# Patient Record
Sex: Female | Born: 1995 | Race: White | Hispanic: No | Marital: Married | Smoking: Never smoker
Health system: Southern US, Community
[De-identification: ages and names within clinical notes are randomized; demographics above are authoritative.]

## PROBLEM LIST (undated history)

## (undated) DIAGNOSIS — R55 Syncope and collapse: Secondary | ICD-10-CM

## (undated) DIAGNOSIS — F1910 Other psychoactive substance abuse, uncomplicated: Secondary | ICD-10-CM

## (undated) DIAGNOSIS — T7840XA Allergy, unspecified, initial encounter: Secondary | ICD-10-CM

## (undated) DIAGNOSIS — F32A Depression, unspecified: Secondary | ICD-10-CM

## (undated) DIAGNOSIS — D649 Anemia, unspecified: Secondary | ICD-10-CM

## (undated) DIAGNOSIS — F419 Anxiety disorder, unspecified: Secondary | ICD-10-CM

## (undated) HISTORY — DX: Depression, unspecified: F32.A

## (undated) HISTORY — DX: Other psychoactive substance abuse, uncomplicated: F19.10

## (undated) HISTORY — DX: Syncope and collapse: R55

## (undated) HISTORY — DX: Anxiety disorder, unspecified: F41.9

## (undated) HISTORY — DX: Anemia, unspecified: D64.9

## (undated) HISTORY — DX: Allergy, unspecified, initial encounter: T78.40XA

---
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
## 2015-07-28 ENCOUNTER — Ambulatory Visit (INDEPENDENT_AMBULATORY_CARE_PROVIDER_SITE_OTHER): Payer: 59 | Admitting: Certified Nurse Midwife

## 2015-07-28 ENCOUNTER — Encounter: Payer: Self-pay | Admitting: Certified Nurse Midwife

## 2015-07-28 VITALS — BP 90/60 | HR 68 | Resp 16 | Ht 64.5 in | Wt 109.0 lb

## 2015-07-28 DIAGNOSIS — Z01419 Encounter for gynecological examination (general) (routine) without abnormal findings: Secondary | ICD-10-CM

## 2015-07-28 DIAGNOSIS — Z0000 Encounter for general adult medical examination without abnormal findings: Secondary | ICD-10-CM

## 2015-07-28 LAB — HEMOGLOBIN, FINGERSTICK: HEMOGLOBIN, FINGERSTICK: 11.8 g/dL — AB (ref 12.0–16.0)

## 2015-07-28 LAB — POCT URINALYSIS DIPSTICK
Bilirubin, UA: NEGATIVE
GLUCOSE UA: NEGATIVE
KETONES UA: NEGATIVE
Leukocytes, UA: NEGATIVE
Nitrite, UA: NEGATIVE
Protein, UA: NEGATIVE
RBC UA: NEGATIVE
UROBILINOGEN UA: NEGATIVE
pH, UA: 5

## 2015-07-28 NOTE — Progress Notes (Signed)
19 y.o. G0P0 Single  Caucasian Fe here to establish gyn care and  for annual exam. Periods are normal, monthly occasional every other month, no cramping, with heavy early in cycle. No problems with menses.History of Syncope with standing, or heat/stress induced. Sees PCP prn. Desires STD screening, all. Partner change. Condoms working well for contraception, does not desire change. No other health issues today. Will be starting college in spring.  Patient's last menstrual period was 06/30/2015.          Sexually active: Yes.    The current method of family planning is condoms all the time.    Exercising: No.  exercise Smoker:  no  Health Maintenance: Pap:  none MMG:  none Colonoscopy:  none BMD:   none TDaP:  2010 Labs: hgb-11.8, poct urine-neg Self breast exam: done monthly   reports that she has never smoked. She has never used smokeless tobacco. She reports that she does not drink alcohol or use illicit drugs.  Past Medical History  Diagnosis Date  . Neurocardiogenic syncope     History reviewed. No pertinent past surgical history.  Current Outpatient Prescriptions  Medication Sig Dispense Refill  . ibuprofen (ADVIL,MOTRIN) 200 MG tablet Take 600 mg by mouth every 6 (six) hours as needed for mild pain.     No current facility-administered medications for this visit.    Family History  Problem Relation Age of Onset  . Stroke Maternal Grandmother   . Cancer Maternal Grandfather   . Diabetes Maternal Grandfather   . Stroke Maternal Grandfather   . Heart attack Maternal Grandfather   . Breast cancer Paternal Grandmother     ROS:  Pertinent items are noted in HPI.  Otherwise, a comprehensive ROS was negative.  Exam:   BP 90/60 mmHg  Pulse 68  Resp 16  Ht 5' 4.5" (1.638 m)  Wt 109 lb (49.442 kg)  BMI 18.43 kg/m2  LMP 06/30/2015 Height: 5' 4.5" (163.8 cm) Ht Readings from Last 3 Encounters:  07/28/15 5' 4.5" (1.638 m) (53 %*, Z = 0.09)  10/14/13 5' 4.75" (1.645 m)  (59 %*, Z = 0.22)   * Growth percentiles are based on CDC 2-20 Years data.    General appearance: alert, cooperative and appears stated age Head: Normocephalic, without obvious abnormality, atraumatic Neck: no adenopathy, supple, symmetrical, trachea midline and thyroid normal to inspection and palpation Lungs: clear to auscultation bilaterally Breasts: normal appearance, no masses or tenderness, No nipple retraction or dimpling, No nipple discharge or bleeding, No axillary or supraclavicular adenopathy Heart: regular rate and rhythm Abdomen: soft, non-tender; no masses,  no organomegaly Extremities: extremities normal, atraumatic, no cyanosis or edema Skin: Skin color, texture, turgor normal. No rashes or lesions Lymph nodes: Cervical, supraclavicular, and axillary nodes normal. No abnormal inguinal nodes palpated Neurologic: Grossly normal   Pelvic: External genitalia:  no lesions              Urethra:  normal appearing urethra with no masses, tenderness or lesions              Bartholin's and Skene's: normal                 Vagina: normal appearing vagina with normal color and discharge, no lesions              Cervix: nomal, non tender,no lesions              Pap taken: No. Bimanual Exam:  Uterus:  normal size, contour,  position, consistency, mobility, non-tender and anteverted              Adnexa: normal adnexa and no mass, fullness, tenderness               Rectovaginal: Confirms               Anus:  normal appearance  Chaperone present: yes  A:  Well Woman with normal exam  Contraception condoms consistent  History of Syncope  STD screening  P:   Reviewed health and wellness pertinent to exam  Discussed importance of consistent use for STD and pregnancy prevention.  Aware of what she needs to do when syncope  occurs.  Labs: HIV,RPR,Hep. C, HSV 1,2, GC,Chlamydia, Affirm  Pap smear as above not taken   counseled on breast self exam, STD prevention, HIV risk factors and  prevention, adequate intake of calcium and vitamin D, diet and exercise  return annually or prn  An After Visit Summary was printed and given to the patient.

## 2015-07-28 NOTE — Patient Instructions (Signed)
General topics  Next pap or exam is  due in 1 year Take a Women's multivitamin Take 1200 mg. of calcium daily - prefer dietary If any concerns in interim to call back  Breast Self-Awareness Practicing breast self-awareness may pick up problems early, prevent significant medical complications, and possibly save your life. By practicing breast self-awareness, you can become familiar with how your breasts look and feel and if your breasts are changing. This allows you to notice changes early. It can also offer you some reassurance that your breast health is good. One way to learn what is normal for your breasts and whether your breasts are changing is to do a breast self-exam. If you find a lump or something that was not present in the past, it is best to contact your caregiver right away. Other findings that should be evaluated by your caregiver include nipple discharge, especially if it is bloody; skin changes or reddening; areas where the skin seems to be pulled in (retracted); or new lumps and bumps. Breast pain is seldom associated with cancer (malignancy), but should also be evaluated by a caregiver. BREAST SELF-EXAM The best time to examine your breasts is 5 7 days after your menstrual period is over.  ExitCare Patient Information 2013 [employer].   Exercise to Stay Healthy Exercise helps you become and stay healthy. EXERCISE IDEAS AND TIPS Choose exercises that:  You enjoy.  Fit into your day. You do not need to exercise really hard to be healthy. You can do exercises at a slow or medium level and stay healthy. You can:  Stretch before and after working out.  Try yoga, Pilates, or tai chi.  Lift weights.  Walk fast, swim, jog, run, climb stairs, bicycle, dance, or rollerskate.  Take aerobic classes. Exercises that burn about 150 calories:  Running 1  miles in 15 minutes.  Playing volleyball for 45 to 60 minutes.  Washing and waxing a car for 45 to 60  minutes.  Playing touch football for 45 minutes.  Walking 1  miles in 35 minutes.  Pushing a stroller 1  miles in 30 minutes.  Playing basketball for 30 minutes.  Raking leaves for 30 minutes.  Bicycling 5 miles in 30 minutes.  Walking 2 miles in 30 minutes.  Dancing for 30 minutes.  Shoveling snow for 15 minutes.  Swimming laps for 20 minutes.  Walking up stairs for 15 minutes.  Bicycling 4 miles in 15 minutes.  Gardening for 30 to 45 minutes.  Jumping rope for 15 minutes.  Washing windows or floors for 45 to 60 minutes. Document Released: 12/23/2010 Document Revised: 02/12/2012 Document Reviewed: 12/23/2010 ExitCare Patient Information 2013 [employer].   Other topics ( that may be useful information):    Sexually Transmitted Disease Sexually transmitted disease (STD) refers to any infection that is passed from person to person during sexual activity. This may happen by way of saliva, semen, blood, vaginal mucus, or urine. Common STDs include:  Gonorrhea.  Chlamydia.  Syphilis.  HIV/AIDS.  Genital herpes.  Hepatitis B and C.  Trichomonas.  Human papillomavirus (HPV).  Pubic lice. CAUSES  An STD may be spread by bacteria, virus, or parasite. A person can get an STD by:  Sexual intercourse with an infected person.  Sharing sex toys with an infected person.  Sharing needles with an infected person.  Having intimate contact with the genitals, mouth, or rectal areas of an infected person. SYMPTOMS  Some people may not have any symptoms, but   they can still pass the infection to others. Different STDs have different symptoms. Symptoms include:  Painful or bloody urination.  Pain in the pelvis, abdomen, vagina, anus, throat, or eyes.  Skin rash, itching, irritation, growths, or sores (lesions). These usually occur in the genital or anal area.  Abnormal vaginal discharge.  Penile discharge in men.  Soft, flesh-colored skin growths in the  genital or anal area.  Fever.  Pain or bleeding during sexual intercourse.  Swollen glands in the groin area.  Yellow skin and eyes (jaundice). This is seen with hepatitis. DIAGNOSIS  To make a diagnosis, your caregiver may:  Take a medical history.  Perform a physical exam.  Take a specimen (culture) to be examined.  Examine a sample of discharge under a microscope.  Perform blood test TREATMENT   Chlamydia, gonorrhea, trichomonas, and syphilis can be cured with antibiotic medicine.  Genital herpes, hepatitis, and HIV can be treated, but not cured, with prescribed medicines. The medicines will lessen the symptoms.  Genital warts from HPV can be treated with medicine or by freezing, burning (electrocautery), or surgery. Warts may come back.  HPV is a virus and cannot be cured with medicine or surgery.However, abnormal areas may be followed very closely by your caregiver and may be removed from the cervix, vagina, or vulva through office procedures or surgery. If your diagnosis is confirmed, your recent sexual partners need treatment. This is true even if they are symptom-free or have a negative culture or evaluation. They should not have sex until their caregiver says it is okay. HOME CARE INSTRUCTIONS  All sexual partners should be informed, tested, and treated for all STDs.  Take your antibiotics as directed. Finish them even if you start to feel better.  Only take over-the-counter or prescription medicines for pain, discomfort, or fever as directed by your caregiver.  Rest.  Eat a balanced diet and drink enough fluids to keep your urine clear or pale yellow.  Do not have sex until treatment is completed and you have followed up with your caregiver. STDs should be checked after treatment.  Keep all follow-up appointments, Pap tests, and blood tests as directed by your caregiver.  Only use latex condoms and water-soluble lubricants during sexual activity. Do not use  petroleum jelly or oils.  Avoid alcohol and illegal drugs.  Get vaccinated for HPV and hepatitis. If you have not received these vaccines in the past, talk to your caregiver about whether one or both might be right for you.  Avoid risky sex practices that can break the skin. The only way to avoid getting an STD is to avoid all sexual activity.Latex condoms and dental dams (for oral sex) will help lessen the risk of getting an STD, but will not completely eliminate the risk. SEEK MEDICAL CARE IF:   You have a fever.  You have any new or worsening symptoms. Document Released: 02/10/2003 Document Revised: 02/12/2012 Document Reviewed: 02/17/2011 [facility] Patient Information 2013 [place].    Domestic Abuse You are being battered or abused if someone close to you hits, pushes, or physically hurts you in any way. You also are being abused if you are forced into activities. You are being sexually abused if you are forced to have sexual contact of any kind. You are being emotionally abused if you are made to feel worthless or if you are constantly threatened. It is important to remember that help is available. No one has the right to abuse you. PREVENTION OF FURTHER  ABUSE  Learn the warning signs of danger. This varies with situations but may include: the use of alcohol, threats, isolation from friends and family, or forced sexual contact. Leave if you feel that violence is going to occur.  If you are attacked or beaten, report it to the police so the abuse is documented. You do not have to press charges. The police can protect you while you or the attackers are leaving. Get the officer's name and badge number and a copy of the report.  Find someone you can trust and tell them what is happening to you: your caregiver, a nurse, clergy member, close friend or family member. Feeling ashamed is natural, but remember that you have done nothing wrong. No one deserves abuse. Document Released:  11/17/2000 Document Revised: 02/12/2012 Document Reviewed: 01/26/2011 ExitCare Patient Information 2013 [employer].    How Much is Too Much Alcohol? Drinking too much alcohol can cause injury, accidents, and health problems. These types of problems can include:   Car crashes.  Falls.  Family fighting (domestic violence).  Drowning.  Fights.  Injuries.  Burns.  Damage to certain organs.  Having a baby with birth defects. ONE DRINK CAN BE TOO MUCH WHEN YOU ARE:  Working.  Pregnant or breastfeeding.  Taking medicines. Ask your doctor.  Driving or planning to drive. If you or someone you know has a drinking problem, get help from a doctor.  Document Released: 09/16/2009 Document Revised: 02/12/2012 Document Reviewed: 09/16/2009 ExitCare Patient Information 2013 [employer].   Smoking Hazards Smoking cigarettes is extremely bad for your health. Tobacco smoke has over 200 known poisons in it. There are over 60 chemicals in tobacco smoke that cause cancer. Some of the chemicals found in cigarette smoke include:   Cyanide.  Benzene.  Formaldehyde.  Methanol (wood alcohol).  Acetylene (fuel used in welding torches).  Ammonia. Cigarette smoke also contains the poisonous gases nitrogen oxide and carbon monoxide.  Cigarette smokers have an increased risk of many serious medical problems and Smoking causes approximately:  90% of all lung cancer deaths in men.  80% of all lung cancer deaths in women.  90% of deaths from chronic obstructive lung disease. Compared with nonsmokers, smoking increases the risk of:  Coronary heart disease by 2 to 4 times.  Stroke by 2 to 4 times.  Men developing lung cancer by 23 times.  Women developing lung cancer by 13 times.  Dying from chronic obstructive lung diseases by 12 times.  . Smoking is the most preventable cause of death and disease in our society.  WHY IS SMOKING ADDICTIVE?  Nicotine is the chemical  agent in tobacco that is capable of causing addiction or dependence.  When you smoke and inhale, nicotine is absorbed rapidly into the bloodstream through your lungs. Nicotine absorbed through the lungs is capable of creating a powerful addiction. Both inhaled and non-inhaled nicotine may be addictive.  Addiction studies of cigarettes and spit tobacco show that addiction to nicotine occurs mainly during the teen years, when young people begin using tobacco products. WHAT ARE THE BENEFITS OF QUITTING?  There are many health benefits to quitting smoking.   Likelihood of developing cancer and heart disease decreases. Health improvements are seen almost immediately.  Blood pressure, pulse rate, and breathing patterns start returning to normal soon after quitting. QUITTING SMOKING   American Lung Association - 1-800-LUNGUSA  American Cancer Society - 1-800-ACS-2345 Document Released: 12/28/2004 Document Revised: 02/12/2012 Document Reviewed: 09/01/2009 ExitCare Patient Information 2013 ExitCare[employer]   LL[employer].   Stress Management Stress is a state of physical or mental tension that often results from changes in your life or normal routine. Some common causes of stress are:  Death of a loved one.  Injuries or severe illnesses.  Getting fired or changing jobs.  Moving into a new home. Other causes may be:  Sexual problems.  Business or financial losses.  Taking on a large debt.  Regular conflict with someone at home or at work.  Constant tiredness from lack of sleep. It is not just bad things that are stressful. It may be stressful to:  Win the lottery.  Get married.  Buy a new car. The amount of stress that can be easily tolerated varies from person to person. Changes generally cause stress, regardless of the types of change. Too much stress can affect your health. It may lead to physical or emotional problems. Too little stress (boredom) may also become stressful. SUGGESTIONS TO  REDUCE STRESS:  Talk things over with your family and friends. It often is helpful to share your concerns and worries. If you feel your problem is serious, you may want to get help from a professional counselor.  Consider your problems one at a time instead of lumping them all together. Trying to take care of everything at once may seem impossible. List all the things you need to do and then start with the most important one. Set a goal to accomplish 2 or 3 things each day. If you expect to do too many in a single day you will naturally fail, causing you to feel even more stressed.  Do not use alcohol or drugs to relieve stress. Although you may feel better for a short time, they do not remove the problems that caused the stress. They can also be habit forming.  Exercise regularly - at least 3 times per week. Physical exercise can help to relieve that "uptight" feeling and will relax you.  The shortest distance between despair and hope is often a good night's sleep.  Go to bed and get up on time allowing yourself time for appointments without being rushed.  Take a short "time-out" period from any stressful situation that occurs during the day. Close your eyes and take some deep breaths. Starting with the muscles in your face, tense them, hold it for a few seconds, then relax. Repeat this with the muscles in your neck, shoulders, hand, stomach, back and legs.  Take good care of yourself. Eat a balanced diet and get plenty of rest.  Schedule time for having fun. Take a break from your daily routine to relax. HOME CARE INSTRUCTIONS   Call if you feel overwhelmed by your problems and feel you can no longer manage them on your own.  Return immediately if you feel like hurting yourself or someone else. Document Released: 05/16/2001 Document Revised: 02/12/2012 Document Reviewed: 01/06/2008 [facility] Patient Information 2013 [place].   Great to meet you today. Reduce your caffeine to see  if this decreases breast tenderness. Call if no change Have a good summer. Natalie [name]

## 2015-07-29 LAB — RPR

## 2015-07-29 LAB — WET PREP BY MOLECULAR PROBE
Candida species: NEGATIVE
GARDNERELLA VAGINALIS: NEGATIVE
Trichomonas vaginosis: NEGATIVE

## 2015-07-29 LAB — HEPATITIS C ANTIBODY: HCV AB: NEGATIVE

## 2015-07-29 LAB — HSV(HERPES SIMPLEX VRS) I + II AB-IGG: HSV 2 Glycoprotein G Ab, IgG: <0.1 IV

## 2015-07-29 LAB — HIV ANTIBODY (ROUTINE TESTING W REFLEX): HIV 1&2 Ab, 4th Generation: NONREACTIVE

## 2015-07-30 LAB — IPS N GONORRHOEA AND CHLAMYDIA BY PCR

## 2015-07-30 NOTE — Progress Notes (Signed)
Reviewed personally.  M. Suzanne Johnisha Louks, MD.  

## 2016-07-28 ENCOUNTER — Encounter: Payer: Self-pay | Admitting: Certified Nurse Midwife

## 2016-07-28 ENCOUNTER — Ambulatory Visit (INDEPENDENT_AMBULATORY_CARE_PROVIDER_SITE_OTHER): Payer: 59 | Admitting: Certified Nurse Midwife

## 2016-07-28 VITALS — BP 98/60 | HR 80 | Resp 16 | Ht 64.0 in | Wt 106.2 lb

## 2016-07-28 DIAGNOSIS — Z30011 Encounter for initial prescription of contraceptive pills: Secondary | ICD-10-CM

## 2016-07-28 DIAGNOSIS — N920 Excessive and frequent menstruation with regular cycle: Secondary | ICD-10-CM

## 2016-07-28 DIAGNOSIS — Z01419 Encounter for gynecological examination (general) (routine) without abnormal findings: Secondary | ICD-10-CM | POA: Diagnosis not present

## 2016-07-28 MED ORDER — NORETHIN ACE-ETH ESTRAD-FE 1-20 MG-MCG PO TABS: 1.0000 | ORAL_TABLET | Freq: Every day | ORAL | 1 refills | Status: DC

## 2016-07-28 NOTE — Progress Notes (Signed)
20 y.o. G0P0 Single  Caucasian Fe here for annual exam. Periods really heavy day 1-2 days then lighter, some cramping and diarrhea at onset. Regular, no missed periods. Heavy since onset. Desires no labs unless absolutely necessary. Patient interested in starting on OCP for cycle control and contraception if needed. Sees PCP prn. [facility]  Patient's last menstrual period was 07/15/2016.          Sexually active: No.  The current method of family planning is condoms most of the time.    Exercising: No.  The patient does not participate in regular exercise at present. Smoker:  no  Health Maintenance: TDaP:  2010 Hep C and HIV: 07/28/15 Neg Labs: No Self breast exams: yes   reports that she has never smoked. She has never used smokeless tobacco. She reports that she does not drink alcohol or use drugs.  Past Medical History:  Diagnosis Date  . Neurocardiogenic syncope     No past surgical history on file.  Current Outpatient Prescriptions  Medication Sig Dispense Refill  . ibuprofen (ADVIL,MOTRIN) 200 MG tablet Take 600 mg by mouth every 6 (six) hours as needed for mild pain.     No current facility-administered medications for this visit.     Family History  Problem Relation Age of Onset  . Stroke Maternal Grandmother   . Cancer Maternal Grandfather   . Diabetes Maternal Grandfather   . Stroke Maternal Grandfather   . Heart attack Maternal Grandfather   . Breast cancer Paternal Grandmother 60    ROS:  Pertinent items are noted in HPI.  Otherwise, a comprehensive ROS was negative.  Exam:   BP 98/60 (BP Location: Right Arm, Patient Position: Sitting, Cuff Size: Normal)   Pulse 80   Resp 16   Ht 5\' 4"  (1.626 m)   Wt 106 lb 3.2 oz (48.2 kg)   LMP 07/15/2016   BMI 18.23 kg/m  Height: 5\' 4"  (162.6 cm) Ht Readings from Last 3 Encounters:  07/28/16 5\' 4"  (1.626 m)  07/28/15 5' 4.5" (1.638 m) (53 %, Z= 0.09)*  10/14/13 5' 4.75" (1.645 m) (59 %, Z= 0.22)*   *  Growth percentiles are based on CDC 2-20 Years data.    General appearance: alert, cooperative and appears stated age Head: Normocephalic, without obvious abnormality, atraumatic Neck: no adenopathy, supple, symmetrical, trachea midline and thyroid normal to inspection and palpation Lungs: clear to auscultation bilaterally Breasts: normal appearance, no masses or tenderness, No nipple retraction or dimpling, No nipple discharge or bleeding, No axillary or supraclavicular adenopathy Heart: regular rate and rhythm Abdomen: soft, non-tender; no masses,  no organomegaly Extremities: extremities normal, atraumatic, no cyanosis or edema Skin: Skin color, texture, turgor normal. No rashes or lesions Lymph nodes: Cervical, supraclavicular, and axillary nodes normal. No abnormal inguinal nodes palpated Neurologic: Grossly normal   Pelvic: External genitalia:  no lesions              Urethra:  normal appearing urethra with no masses, tenderness or lesions              Bartholin's and Skene's: normal                 Vagina: normal appearing vagina with normal color and discharge, no lesions              Cervix: no cervical motion tenderness, no lesions and nulliparous appearance              Pap  taken: No. Bimanual Exam:  Uterus:  normal size, contour, position, consistency, mobility, non-tender              Adnexa: normal adnexa and no mass, fullness, tenderness               Rectovaginal: Confirms               Anus:  normal   Chaperone present: yes  A:  Well Woman with normal exam  Menorrhagia with regular cycle, desires cycle control and contraception with OCP  P:   Reviewed health and wellness pertinent to exam  Discussed risks and benefits for OCP, expectations with cycle control, warning signs and side effects. Questions addressed and would to try.  Rx Loestrin 1/20 Fe see order with instructions, recheck in 3 months for evaluation  Pap smear as above not taken   counseled on breast  self exam, STD prevention, HIV risk factors and prevention, use and side effects of OCP's, adequate intake of calcium and vitamin D, diet and exercise  return annually or prn, as above  An After Visit Summary was printed and given to the patient.

## 2016-07-28 NOTE — Patient Instructions (Signed)
General topics  Next pap or exam is  due in 1 year Take a Women's multivitamin Take 1200 mg. of calcium daily - prefer dietary If any concerns in interim to call back  Breast Self-Awareness Practicing breast self-awareness may pick up problems early, prevent significant medical complications, and possibly save your life. By practicing breast self-awareness, you can become familiar with how your breasts look and feel and if your breasts are changing. This allows you to notice changes early. It can also offer you some reassurance that your breast health is good. One way to learn what is normal for your breasts and whether your breasts are changing is to do a breast self-exam. If you find a lump or something that was not present in the past, it is best to contact your caregiver right away. Other findings that should be evaluated by your caregiver include nipple discharge, especially if it is bloody; skin changes or reddening; areas where the skin seems to be pulled in (retracted); or new lumps and bumps. Breast pain is seldom associated with cancer (malignancy), but should also be evaluated by a caregiver. BREAST SELF-EXAM The best time to examine your breasts is 5 7 days after your menstrual period is over.  ExitCare Patient Information 2013 [employer].   Exercise to Stay Healthy Exercise helps you become and stay healthy. EXERCISE IDEAS AND TIPS Choose exercises that:  You enjoy.  Fit into your day. You do not need to exercise really hard to be healthy. You can do exercises at a slow or medium level and stay healthy. You can:  Stretch before and after working out.  Try yoga, Pilates, or tai chi.  Lift weights.  Walk fast, swim, jog, run, climb stairs, bicycle, dance, or rollerskate.  Take aerobic classes. Exercises that burn about 150 calories:  Running 1  miles in 15 minutes.  Playing volleyball for 45 to 60 minutes.  Washing and waxing a car for 45 to 60  minutes.  Playing touch football for 45 minutes.  Walking 1  miles in 35 minutes.  Pushing a stroller 1  miles in 30 minutes.  Playing basketball for 30 minutes.  Raking leaves for 30 minutes.  Bicycling 5 miles in 30 minutes.  Walking 2 miles in 30 minutes.  Dancing for 30 minutes.  Shoveling snow for 15 minutes.  Swimming laps for 20 minutes.  Walking up stairs for 15 minutes.  Bicycling 4 miles in 15 minutes.  Gardening for 30 to 45 minutes.  Jumping rope for 15 minutes.  Washing windows or floors for 45 to 60 minutes. Document Released: 12/23/2010 Document Revised: 02/12/2012 Document Reviewed: 12/23/2010 ExitCare Patient Information 2013 [employer].   Other topics ( that may be useful information):    Sexually Transmitted Disease Sexually transmitted disease (STD) refers to any infection that is passed from person to person during sexual activity. This may happen by way of saliva, semen, blood, vaginal mucus, or urine. Common STDs include:  Gonorrhea.  Chlamydia.  Syphilis.  HIV/AIDS.  Genital herpes.  Hepatitis B and C.  Trichomonas.  Human papillomavirus (HPV).  Pubic lice. CAUSES  An STD may be spread by bacteria, virus, or parasite. A person can get an STD by:  Sexual intercourse with an infected person.  Sharing sex toys with an infected person.  Sharing needles with an infected person.  Having intimate contact with the genitals, mouth, or rectal areas of an infected person. SYMPTOMS  Some people may not have any symptoms, but   they can still pass the infection to others. Different STDs have different symptoms. Symptoms include:  Painful or bloody urination.  Pain in the pelvis, abdomen, vagina, anus, throat, or eyes.  Skin rash, itching, irritation, growths, or sores (lesions). These usually occur in the genital or anal area.  Abnormal vaginal discharge.  Penile discharge in men.  Soft, flesh-colored skin growths in the  genital or anal area.  Fever.  Pain or bleeding during sexual intercourse.  Swollen glands in the groin area.  Yellow skin and eyes (jaundice). This is seen with hepatitis. DIAGNOSIS  To make a diagnosis, your caregiver may:  Take a medical history.  Perform a physical exam.  Take a specimen (culture) to be examined.  Examine a sample of discharge under a microscope.  Perform blood test TREATMENT   Chlamydia, gonorrhea, trichomonas, and syphilis can be cured with antibiotic medicine.  Genital herpes, hepatitis, and HIV can be treated, but not cured, with prescribed medicines. The medicines will lessen the symptoms.  Genital warts from HPV can be treated with medicine or by freezing, burning (electrocautery), or surgery. Warts may come back.  HPV is a virus and cannot be cured with medicine or surgery.However, abnormal areas may be followed very closely by your caregiver and may be removed from the cervix, vagina, or vulva through office procedures or surgery. If your diagnosis is confirmed, your recent sexual partners need treatment. This is true even if they are symptom-free or have a negative culture or evaluation. They should not have sex until their caregiver says it is okay. HOME CARE INSTRUCTIONS  All sexual partners should be informed, tested, and treated for all STDs.  Take your antibiotics as directed. Finish them even if you start to feel better.  Only take over-the-counter or prescription medicines for pain, discomfort, or fever as directed by your caregiver.  Rest.  Eat a balanced diet and drink enough fluids to keep your urine clear or pale yellow.  Do not have sex until treatment is completed and you have followed up with your caregiver. STDs should be checked after treatment.  Keep all follow-up appointments, Pap tests, and blood tests as directed by your caregiver.  Only use latex condoms and water-soluble lubricants during sexual activity. Do not use  petroleum jelly or oils.  Avoid alcohol and illegal drugs.  Get vaccinated for HPV and hepatitis. If you have not received these vaccines in the past, talk to your caregiver about whether one or both might be right for you.  Avoid risky sex practices that can break the skin. The only way to avoid getting an STD is to avoid all sexual activity.Latex condoms and dental dams (for oral sex) will help lessen the risk of getting an STD, but will not completely eliminate the risk. SEEK MEDICAL CARE IF:   You have a fever.  You have any new or worsening symptoms. Document Released: 02/10/2003 Document Revised: 02/12/2012 Document Reviewed: 02/17/2011 [facility] Patient Information 2013 [employer].    Domestic Abuse You are being battered or abused if someone close to you hits, pushes, or physically hurts you in any way. You also are being abused if you are forced into activities. You are being sexually abused if you are forced to have sexual contact of any kind. You are being emotionally abused if you are made to feel worthless or if you are constantly threatened. It is important to remember that help is available. No one has the right to abuse you. PREVENTION OF FURTHER  ABUSE  Learn the warning signs of danger. This varies with situations but may include: the use of alcohol, threats, isolation from friends and family, or forced sexual contact. Leave if you feel that violence is going to occur.  If you are attacked or beaten, report it to the police so the abuse is documented. You do not have to press charges. The police can protect you while you or the attackers are leaving. Get the officer's name and badge number and a copy of the report.  Find someone you can trust and tell them what is happening to you: your caregiver, a nurse, clergy member, close friend or family member. Feeling ashamed is natural, but remember that you have done nothing wrong. No one deserves abuse. Document Released:  11/17/2000 Document Revised: 02/12/2012 Document Reviewed: 01/26/2011 ExitCare Patient Information 2013 [employer].    How Much is Too Much Alcohol? Drinking too much alcohol can cause injury, accidents, and health problems. These types of problems can include:   Car crashes.  Falls.  Family fighting (domestic violence).  Drowning.  Fights.  Injuries.  Burns.  Damage to certain organs.  Having a baby with birth defects. ONE DRINK CAN BE TOO MUCH WHEN YOU ARE:  Working.  Pregnant or breastfeeding.  Taking medicines. Ask your doctor.  Driving or planning to drive. If you or someone you know has a drinking problem, get help from a doctor.  Document Released: 09/16/2009 Document Revised: 02/12/2012 Document Reviewed: 09/16/2009 ExitCare Patient Information 2013 [employer].   Smoking Hazards Smoking cigarettes is extremely bad for your health. Tobacco smoke has over 200 known poisons in it. There are over 60 chemicals in tobacco smoke that cause cancer. Some of the chemicals found in cigarette smoke include:   Cyanide.  Benzene.  Formaldehyde.  Methanol (wood alcohol).  Acetylene (fuel used in welding torches).  Ammonia. Cigarette smoke also contains the poisonous gases nitrogen oxide and carbon monoxide.  Cigarette smokers have an increased risk of many serious medical problems and Smoking causes approximately:  90% of all lung cancer deaths in men.  80% of all lung cancer deaths in women.  90% of deaths from chronic obstructive lung disease. Compared with nonsmokers, smoking increases the risk of:  Coronary heart disease by 2 to 4 times.  Stroke by 2 to 4 times.  Men developing lung cancer by 23 times.  Women developing lung cancer by 13 times.  Dying from chronic obstructive lung diseases by 12 times.  . Smoking is the most preventable cause of death and disease in our society.  WHY IS SMOKING ADDICTIVE?  Nicotine is the chemical  agent in tobacco that is capable of causing addiction or dependence.  When you smoke and inhale, nicotine is absorbed rapidly into the bloodstream through your lungs. Nicotine absorbed through the lungs is capable of creating a powerful addiction. Both inhaled and non-inhaled nicotine may be addictive.  Addiction studies of cigarettes and spit tobacco show that addiction to nicotine occurs mainly during the teen years, when young people begin using tobacco products. WHAT ARE THE BENEFITS OF QUITTING?  There are many health benefits to quitting smoking.   Likelihood of developing cancer and heart disease decreases. Health improvements are seen almost immediately.  Blood pressure, pulse rate, and breathing patterns start returning to normal soon after quitting. QUITTING SMOKING   American Lung Association - 1-800-LUNGUSA  American Cancer Society - 1-800-ACS-2345 Document Released: 12/28/2004 Document Revised: 02/12/2012 Document Reviewed: 09/01/2009 ExitCare Patient Information 2013 ExitCare[employer]   LL[employer].   Stress Management Stress is a state of physical or mental tension that often results from changes in your life or normal routine. Some common causes of stress are:  Death of a loved one.  Injuries or severe illnesses.  Getting fired or changing jobs.  Moving into a new home. Other causes may be:  Sexual problems.  Business or financial losses.  Taking on a large debt.  Regular conflict with someone at home or at work.  Constant tiredness from lack of sleep. It is not just bad things that are stressful. It may be stressful to:  Win the lottery.  Get married.  Buy a new car. The amount of stress that can be easily tolerated varies from person to person. Changes generally cause stress, regardless of the types of change. Too much stress can affect your health. It may lead to physical or emotional problems. Too little stress (boredom) may also become stressful. SUGGESTIONS TO  REDUCE STRESS:  Talk things over with your family and friends. It often is helpful to share your concerns and worries. If you feel your problem is serious, you may want to get help from a professional counselor.  Consider your problems one at a time instead of lumping them all together. Trying to take care of everything at once may seem impossible. List all the things you need to do and then start with the most important one. Set a goal to accomplish 2 or 3 things each day. If you expect to do too many in a single day you will naturally fail, causing you to feel even more stressed.  Do not use alcohol or drugs to relieve stress. Although you may feel better for a short time, they do not remove the problems that caused the stress. They can also be habit forming.  Exercise regularly - at least 3 times per week. Physical exercise can help to relieve that "uptight" feeling and will relax you.  The shortest distance between despair and hope is often a good night's sleep.  Go to bed and get up on time allowing yourself time for appointments without being rushed.  Take a short "time-out" period from any stressful situation that occurs during the day. Close your eyes and take some deep breaths. Starting with the muscles in your face, tense them, hold it for a few seconds, then relax. Repeat this with the muscles in your neck, shoulders, hand, stomach, back and legs.  Take good care of yourself. Eat a balanced diet and get plenty of rest.  Schedule time for having fun. Take a break from your daily routine to relax. HOME CARE INSTRUCTIONS   Call if you feel overwhelmed by your problems and feel you can no longer manage them on your own.  Return immediately if you feel like hurting yourself or someone else. Document Released: 05/16/2001 Document Revised: 02/12/2012 Document Reviewed: 01/06/2008 ExitCare Patient Information 2013 [employer].  Oral Contraception Use Oral contraceptive pills (OCPs)  are medicines taken to prevent pregnancy. OCPs work by preventing the ovaries from releasing eggs. The hormones in OCPs also cause the cervical mucus to thicken, preventing the sperm from entering the uterus. The hormones also cause the uterine lining to become thin, not allowing a fertilized egg to attach to the inside of the uterus. OCPs are highly effective when taken exactly as prescribed. However, OCPs do not prevent sexually transmitted diseases (STDs). Safe sex practices, such as using condoms along with an OCP, can help prevent STDs. Before   taking OCPs, you may have a physical exam and Pap test. Your health care provider may also order blood tests if necessary. Your health care provider will make sure you are a good candidate for oral contraception. Discuss with your health care provider the possible side effects of the OCP you may be prescribed. When starting an OCP, it can take 2 to 3 months for the body to adjust to the changes in hormone levels in your body.  HOW TO TAKE ORAL CONTRACEPTIVE PILLS Your health care provider may advise you on how to start taking the first cycle of OCPs. Otherwise, you can:   Start on day 1 of your menstrual period. You will not need any backup contraceptive protection with this start time.   Start on the first Sunday after your menstrual period or the day you get your prescription. In these cases, you will need to use backup contraceptive protection for the first week.   Start the pill at any time of your cycle. If you take the pill within 5 days of the start of your period, you are protected against pregnancy right away. In this case, you will not need a backup form of birth control. If you start at any other time of your menstrual cycle, you will need to use another form of birth control for 7 days. If your OCP is the type called a minipill, it will protect you from pregnancy after taking it for 2 days (48 hours). After you have started taking OCPs:   If you  forget to take 1 pill, take it as soon as you remember. Take the next pill at the regular time.   If you miss 2 or more pills, call your health care provider because different pills have different instructions for missed doses. Use backup birth control until your next menstrual period starts.   If you use a 28-day pack that contains inactive pills and you miss 1 of the last 7 pills (pills with no hormones), it will not matter. Throw away the rest of the non-hormone pills and start a new pill pack.  No matter which day you start the OCP, you will always start a new pack on that same day of the week. Have an extra pack of OCPs and a backup contraceptive method available in case you miss some pills or lose your OCP pack.  HOME CARE INSTRUCTIONS   Do not smoke.   Always use a condom to protect against STDs. OCPs do not protect against STDs.   Use a calendar to mark your menstrual period days.   Read the information and directions that came with your OCP. Talk to your health care provider if you have questions.  SEEK MEDICAL CARE IF:   You develop nausea and vomiting.   You have abnormal vaginal discharge or bleeding.   You develop a rash.   You miss your menstrual period.   You are losing your hair.   You need treatment for mood swings or depression.   You get dizzy when taking the OCP.   You develop acne from taking the OCP.   You become pregnant.  SEEK IMMEDIATE MEDICAL CARE IF:   You develop chest pain.   You develop shortness of breath.   You have an uncontrolled or severe headache.   You develop numbness or slurred speech.   You develop visual problems.   You develop pain, redness, and swelling in the legs.    This information is not intended to   replace advice given to you by your health care provider. Make sure you discuss any questions you have with your health care provider.   Document Released: 11/09/2011 Document Revised: 12/11/2014  Document Reviewed: 05/11/2013 Elsevier Interactive Patient Education 2016 Elsevier Inc.  

## 2016-07-29 NOTE — Progress Notes (Signed)
Encounter reviewed Jeanifer Halliday, MD   

## 2016-09-03 DIAGNOSIS — Z23 Encounter for immunization: Secondary | ICD-10-CM | POA: Diagnosis not present

## 2016-09-03 DIAGNOSIS — Z111 Encounter for screening for respiratory tuberculosis: Secondary | ICD-10-CM | POA: Diagnosis not present

## 2016-11-03 ENCOUNTER — Ambulatory Visit (INDEPENDENT_AMBULATORY_CARE_PROVIDER_SITE_OTHER): Payer: 59 | Admitting: Certified Nurse Midwife

## 2016-11-03 ENCOUNTER — Encounter: Payer: Self-pay | Admitting: Certified Nurse Midwife

## 2016-11-03 VITALS — BP 98/62 | HR 60 | Resp 16 | Ht 64.0 in | Wt 106.0 lb

## 2016-11-03 DIAGNOSIS — N920 Excessive and frequent menstruation with regular cycle: Secondary | ICD-10-CM | POA: Diagnosis not present

## 2016-11-03 DIAGNOSIS — Z30011 Encounter for initial prescription of contraceptive pills: Secondary | ICD-10-CM | POA: Diagnosis not present

## 2016-11-03 DIAGNOSIS — Z3041 Encounter for surveillance of contraceptive pills: Secondary | ICD-10-CM

## 2016-11-03 MED ORDER — NORETHIN ACE-ETH ESTRAD-FE 1-20 MG-MCG PO TABS: 1.0000 | ORAL_TABLET | Freq: Every day | ORAL | 4 refills | Status: DC

## 2016-11-03 NOTE — Progress Notes (Signed)
20 y.o. Single Caucasian [id number] for evaluation of  initiated on August for 07/28/16. Menses duration 5 days with light flow. Patient taking medication as prescribed. Denies missed pills, headaches at onset of taking OCP, no aura and last only a few minutes, nausea, DVT warning signs or symptoms,  breakthrough bleeding, or other changes.   Keeping menses calendar. No other health issues today. Happy with choice!  O: Healthy female, WD WN Affect: normal orientation X 3    A: History of Dysmenorrhea with  Menorrhagia working well  P:Normal surveillance of Loestrin 24 Fe. Reviewed warning signs and need to evaluate.   20 minutes spent with patient with >50% of time spent in face to face counseling regarding contraception.   RV aex

## 2016-11-03 NOTE — Patient Instructions (Signed)
Oral Contraception Use Oral contraceptive pills (OCPs) are medicines taken to prevent pregnancy. OCPs work by preventing the ovaries from releasing eggs. The hormones in OCPs also cause the cervical mucus to thicken, preventing the sperm from entering the uterus. The hormones also cause the uterine lining to become thin, not allowing a fertilized egg to attach to the inside of the uterus. OCPs are highly effective when taken exactly as prescribed. However, OCPs do not prevent sexually transmitted diseases (STDs). Safe sex practices, such as using condoms along with an OCP, can help prevent STDs. Before taking OCPs, you may have a physical exam and Pap test. Your health care provider may also order blood tests if necessary. Your health care provider will make sure you are a good candidate for oral contraception. Discuss with your health care provider the possible side effects of the OCP you may be prescribed. When starting an OCP, it can take 2 to 3 months for the body to adjust to the changes in hormone levels in your body. How to take oral contraceptive pills Your health care provider may advise you on how to start taking the first cycle of OCPs. Otherwise, you can:  Start on day 1 of your menstrual period. You will not need any backup contraceptive protection with this start time.  Start on the first Sunday after your menstrual period or the day you get your prescription. In these cases, you will need to use backup contraceptive protection for the first week.  Start the pill at any time of your cycle. If you take the pill within 5 days of the start of your period, you are protected against pregnancy right away. In this case, you will not need a backup form of birth control. If you start at any other time of your menstrual cycle, you will need to use another form of birth control for 7 days. If your OCP is the type called a minipill, it will protect you from pregnancy after taking it for 2 days (48  hours).  After you have started taking OCPs:  If you forget to take 1 pill, take it as soon as you remember. Take the next pill at the regular time.  If you miss 2 or more pills, call your health care provider because different pills have different instructions for missed doses. Use backup birth control until your next menstrual period starts.  If you use a 28-day pack that contains inactive pills and you miss 1 of the last 7 pills (pills with no hormones), it will not matter. Throw away the rest of the non-hormone pills and start a new pill pack.  No matter which day you start the OCP, you will always start a new pack on that same day of the week. Have an extra pack of OCPs and a backup contraceptive method available in case you miss some pills or lose your OCP pack. Follow these instructions at home:  Do not smoke.  Always use a condom to protect against STDs. OCPs do not protect against STDs.  Use a calendar to mark your menstrual period days.  Read the information and directions that came with your OCP. Talk to your health care provider if you have questions. Contact a health care provider if:  You develop nausea and vomiting.  You have abnormal vaginal discharge or bleeding.  You develop a rash.  You miss your menstrual period.  You are losing your hair.  You need treatment for mood swings or depression.  You   get dizzy when taking the OCP.  You develop acne from taking the OCP.  You become pregnant. Get help right away if:  You develop chest pain.  You develop shortness of breath.  You have an uncontrolled or severe headache.  You develop numbness or slurred speech.  You develop visual problems.  You develop pain, redness, and swelling in the legs. This information is not intended to replace advice given to you by your health care provider. Make sure you discuss any questions you have with your health care provider. Document Released: 11/09/2011 Document  Revised: 04/27/2016 Document Reviewed: 05/11/2013 Elsevier Interactive Patient Education  2017 Elsevier Inc.  

## 2016-11-10 NOTE — Progress Notes (Signed)
Encounter reviewed Natalie Gaubert, MD   

## 2017-02-07 ENCOUNTER — Other Ambulatory Visit: Payer: Self-pay | Admitting: Certified Nurse Midwife

## 2017-02-07 DIAGNOSIS — N920 Excessive and frequent menstruation with regular cycle: Secondary | ICD-10-CM

## 2017-02-07 DIAGNOSIS — Z3041 Encounter for surveillance of contraceptive pills: Secondary | ICD-10-CM

## 2017-08-03 ENCOUNTER — Ambulatory Visit: Payer: 59 | Admitting: Certified Nurse Midwife

## 2017-08-03 ENCOUNTER — Telehealth: Payer: Self-pay | Admitting: Certified Nurse Midwife

## 2017-08-03 NOTE — Telephone Encounter (Signed)
LMTCB about canceled appt/rd °

## 2017-10-23 ENCOUNTER — Other Ambulatory Visit (HOSPITAL_COMMUNITY)
Admission: RE | Admit: 2017-10-23 | Discharge: 2017-10-23 | Disposition: A | Discharge status: Home / Self Care | Payer: 59 | Source: Ambulatory Visit | Attending: Obstetrics & Gynecology | Admitting: Obstetrics & Gynecology

## 2017-10-23 ENCOUNTER — Other Ambulatory Visit: Payer: Self-pay

## 2017-10-23 ENCOUNTER — Encounter: Payer: Self-pay | Admitting: Certified Nurse Midwife

## 2017-10-23 ENCOUNTER — Ambulatory Visit (INDEPENDENT_AMBULATORY_CARE_PROVIDER_SITE_OTHER): Payer: 59 | Admitting: Certified Nurse Midwife

## 2017-10-23 VITALS — BP 94/62 | HR 64 | Resp 16 | Ht 64.5 in | Wt 105.0 lb

## 2017-10-23 DIAGNOSIS — Z124 Encounter for screening for malignant neoplasm of cervix: Secondary | ICD-10-CM | POA: Diagnosis not present

## 2017-10-23 DIAGNOSIS — Z01419 Encounter for gynecological examination (general) (routine) without abnormal findings: Secondary | ICD-10-CM

## 2017-10-23 DIAGNOSIS — N912 Amenorrhea, unspecified: Secondary | ICD-10-CM

## 2017-10-23 DIAGNOSIS — Z0000 Encounter for general adult medical examination without abnormal findings: Secondary | ICD-10-CM

## 2017-10-23 LAB — POCT URINALYSIS DIPSTICK
BILIRUBIN UA: NEGATIVE
Glucose, UA: NEGATIVE
Ketones, UA: NEGATIVE
Leukocytes, UA: NEGATIVE
NITRITE UA: NEGATIVE
PH UA: 5 (ref 5.0–8.0)
Protein, UA: NEGATIVE
RBC UA: NEGATIVE
UROBILINOGEN UA: NEGATIVE U/dL — AB

## 2017-10-23 LAB — POCT URINE PREGNANCY: Preg Test, Ur: NEGATIVE

## 2017-10-23 NOTE — Patient Instructions (Signed)
General topics  Next pap or exam is  due in 1 year Take a Women's multivitamin Take 1200 mg. of calcium daily - prefer dietary If any concerns in interim to call back  Breast Self-Awareness Practicing breast self-awareness may pick up problems early, prevent significant medical complications, and possibly save your life. By practicing breast self-awareness, you can become familiar with how your breasts look and feel and if your breasts are changing. This allows you to notice changes early. It can also offer you some reassurance that your breast health is good. One way to learn what is normal for your breasts and whether your breasts are changing is to do a breast self-exam. If you find a lump or something that was not present in the past, it is best to contact your caregiver right away. Other findings that should be evaluated by your caregiver include nipple discharge, especially if it is bloody; skin changes or reddening; areas where the skin seems to be pulled in (retracted); or new lumps and bumps. Breast pain is seldom associated with cancer (malignancy), but should also be evaluated by a caregiver. BREAST SELF-EXAM The best time to examine your breasts is 5 7 days after your menstrual period is over.  ExitCare Patient Information 2013 [employer]Care[employer].   Exercise to Stay Healthy Exercise helps you become and stay healthy. EXERCISE IDEAS AND TIPS Choose exercises that:  You enjoy.  Fit into your day. You do not need to exercise really hard to be healthy. You can do exercises at a slow or medium level and stay healthy. You can:  Stretch before and after working out.  Try yoga, Pilates, or tai chi.  Lift weights.  Walk fast, swim, jog, run, climb stairs, bicycle, dance, or rollerskate.  Take aerobic classes. Exercises that burn about 150 calories:  Running 1  miles in 15 minutes.  Playing volleyball for 45 to 60 minutes.  Washing and waxing a car for 45 to 60  minutes.  Playing touch football for 45 minutes.  Walking 1  miles in 35 minutes.  Pushing a stroller 1  miles in 30 minutes.  Playing basketball for 30 minutes.  Raking leaves for 30 minutes.  Bicycling 5 miles in 30 minutes.  Walking 2 miles in 30 minutes.  Dancing for 30 minutes.  Shoveling snow for 15 minutes.  Swimming laps for 20 minutes.  Walking up stairs for 15 minutes.  Bicycling 4 miles in 15 minutes.  Gardening for 30 to 45 minutes.  Jumping rope for 15 minutes.  Washing windows or floors for 45 to 60 minutes. Document Released: 12/23/2010 Document Revised: 02/12/2012 Document Reviewed: 12/23/2010 ExitCare Patient Information 2013 ExitCare[employer].   Other topics ( that may be useful information):    Sexually Transmitted Disease Sexually transmitted disease (STD) refers to any infection that is passed from person to person during sexual activity. This may happen by way of saliva, semen, blood, vaginal mucus, or urine. Common STDs include:  Gonorrhea.  Chlamydia.  Syphilis.  HIV/AIDS.  Genital herpes.  Hepatitis B and C.  Trichomonas.  Human papillomavirus (HPV).  Pubic lice. CAUSES  An STD may be spread by bacteria, virus, or parasite. A person can get an STD by:  Sexual intercourse with an infected person.  Sharing sex toys with an infected person.  Sharing needles with an infected person.  Having intimate contact with the genitals, mouth, or rectal areas of an infected person. SYMPTOMS  Some people may not have any symptoms, but   they can still pass the infection to others. Different STDs have different symptoms. Symptoms include:  Painful or bloody urination.  Pain in the pelvis, abdomen, vagina, anus, throat, or eyes.  Skin rash, itching, irritation, growths, or sores (lesions). These usually occur in the genital or anal area.  Abnormal vaginal discharge.  Penile discharge in men.  Soft, flesh-colored skin growths in the  genital or anal area.  Fever.  Pain or bleeding during sexual intercourse.  Swollen glands in the groin area.  Yellow skin and eyes (jaundice). This is seen with hepatitis. DIAGNOSIS  To make a diagnosis, your caregiver may:  Take a medical history.  Perform a physical exam.  Take a specimen (culture) to be examined.  Examine a sample of discharge under a microscope.  Perform blood test TREATMENT   Chlamydia, gonorrhea, trichomonas, and syphilis can be cured with antibiotic medicine.  Genital herpes, hepatitis, and HIV can be treated, but not cured, with prescribed medicines. The medicines will lessen the symptoms.  Genital warts from HPV can be treated with medicine or by freezing, burning (electrocautery), or surgery. Warts may come back.  HPV is a virus and cannot be cured with medicine or surgery.However, abnormal areas may be followed very closely by your caregiver and may be removed from the cervix, vagina, or vulva through office procedures or surgery. If your diagnosis is confirmed, your recent sexual partners need treatment. This is true even if they are symptom-free or have a negative culture or evaluation. They should not have sex until their caregiver says it is okay. HOME CARE INSTRUCTIONS  All sexual partners should be informed, tested, and treated for all STDs.  Take your antibiotics as directed. Finish them even if you start to feel better.  Only take over-the-counter or prescription medicines for pain, discomfort, or fever as directed by your caregiver.  Rest.  Eat a balanced diet and drink enough fluids to keep your urine clear or pale yellow.  Do not have sex until treatment is completed and you have followed up with your caregiver. STDs should be checked after treatment.  Keep all follow-up appointments, Pap tests, and blood tests as directed by your caregiver.  Only use latex condoms and water-soluble lubricants during sexual activity. Do not use  petroleum jelly or oils.  Avoid alcohol and illegal drugs.  Get vaccinated for HPV and hepatitis. If you have not received these vaccines in the past, talk to your caregiver about whether one or both might be right for you.  Avoid risky sex practices that can break the skin. The only way to avoid getting an STD is to avoid all sexual activity.Latex condoms and dental dams (for oral sex) will help lessen the risk of getting an STD, but will not completely eliminate the risk. SEEK MEDICAL CARE IF:   You have a fever.  You have any new or worsening symptoms. Document Released: 02/10/2003 Document Revised: 02/12/2012 Document Reviewed: 02/17/2011 [facility] Patient Information 2013 [place].    Domestic Abuse You are being battered or abused if someone close to you hits, pushes, or physically hurts you in any way. You also are being abused if you are forced into activities. You are being sexually abused if you are forced to have sexual contact of any kind. You are being emotionally abused if you are made to feel worthless or if you are constantly threatened. It is important to remember that help is available. No one has the right to abuse you. PREVENTION OF FURTHER  ABUSE  Learn the warning signs of danger. This varies with situations but may include: the use of alcohol, threats, isolation from friends and family, or forced sexual contact. Leave if you feel that violence is going to occur.  If you are attacked or beaten, report it to the police so the abuse is documented. You do not have to press charges. The police can protect you while you or the attackers are leaving. Get the officer's name and badge number and a copy of the report.  Find someone you can trust and tell them what is happening to you: your caregiver, a nurse, clergy member, close friend or family member. Feeling ashamed is natural, but remember that you have done nothing wrong. No one deserves abuse. Document Released:  11/17/2000 Document Revised: 02/12/2012 Document Reviewed: 01/26/2011 ExitCare Patient Information 2013 ExitCare[employer].    How Much is Too Much Alcohol? Drinking too much alcohol can cause injury, accidents, and health problems. These types of problems can include:   Car crashes.  Falls.  Family fighting (domestic violence).  Drowning.  Fights.  Injuries.  Burns.  Damage to certain organs.  Having a baby with birth defects. ONE DRINK CAN BE TOO MUCH WHEN YOU ARE:  Working.  Pregnant or breastfeeding.  Taking medicines. Ask your doctor.  Driving or planning to drive. If you or someone you know has a drinking problem, get help from a doctor.  Document Released: 09/16/2009 Document Revised: 02/12/2012 Document Reviewed: 09/16/2009 ExitCare Patient Information 2013 [employer]Care[employer].   Smoking Hazards Smoking cigarettes is extremely bad for your health. Tobacco smoke has over 200 known poisons in it. There are over 60 chemicals in tobacco smoke that cause cancer. Some of the chemicals found in cigarette smoke include:   Cyanide.  Benzene.  Formaldehyde.  Methanol (wood alcohol).  Acetylene (fuel used in welding torches).  Ammonia. Cigarette smoke also contains the poisonous gases nitrogen oxide and carbon monoxide.  Cigarette smokers have an increased risk of many serious medical problems and Smoking causes approximately:  90% of all lung cancer deaths in men.  80% of all lung cancer deaths in women.  90% of deaths from chronic obstructive lung disease. Compared with nonsmokers, smoking increases the risk of:  Coronary heart disease by 2 to 4 times.  Stroke by 2 to 4 times.  Men developing lung cancer by 23 times.  Women developing lung cancer by 13 times.  Dying from chronic obstructive lung diseases by 12 times.  . Smoking is the most preventable cause of death and disease in our society.  WHY IS SMOKING ADDICTIVE?  Nicotine is the chemical  agent in tobacco that is capable of causing addiction or dependence.  When you smoke and inhale, nicotine is absorbed rapidly into the bloodstream through your lungs. Nicotine absorbed through the lungs is capable of creating a powerful addiction. Both inhaled and non-inhaled nicotine may be addictive.  Addiction studies of cigarettes and spit tobacco show that addiction to nicotine occurs mainly during the teen years, when young people begin using tobacco products. WHAT ARE THE BENEFITS OF QUITTING?  There are many health benefits to quitting smoking.   Likelihood of developing cancer and heart disease decreases. Health improvements are seen almost immediately.  Blood pressure, pulse rate, and breathing patterns start returning to normal soon after quitting. QUITTING SMOKING   American Lung Association - 1-800-LUNGUSA  American Cancer Society - 1-800-ACS-2345 Document Released: 12/28/2004 Document Revised: 02/12/2012 Document Reviewed: 09/01/2009 ExitCare Patient Information 2013 ExitCare,   LLC.   Stress Management Stress is a state of physical or mental tension that often results from changes in your life or normal routine. Some common causes of stress are:  Death of a loved one.  Injuries or severe illnesses.  Getting fired or changing jobs.  Moving into a new home. Other causes may be:  Sexual problems.  Business or financial losses.  Taking on a large debt.  Regular conflict with someone at home or at work.  Constant tiredness from lack of sleep. It is not just bad things that are stressful. It may be stressful to:  Win the lottery.  Get married.  Buy a new car. The amount of stress that can be easily tolerated varies from person to person. Changes generally cause stress, regardless of the types of change. Too much stress can affect your health. It may lead to physical or emotional problems. Too little stress (boredom) may also become stressful. SUGGESTIONS TO  REDUCE STRESS:  Talk things over with your family and friends. It often is helpful to share your concerns and worries. If you feel your problem is serious, you may want to get help from a professional counselor.  Consider your problems one at a time instead of lumping them all together. Trying to take care of everything at once may seem impossible. List all the things you need to do and then start with the most important one. Set a goal to accomplish 2 or 3 things each day. If you expect to do too many in a single day you will naturally fail, causing you to feel even more stressed.  Do not use alcohol or drugs to relieve stress. Although you may feel better for a short time, they do not remove the problems that caused the stress. They can also be habit forming.  Exercise regularly - at least 3 times per week. Physical exercise can help to relieve that "uptight" feeling and will relax you.  The shortest distance between despair and hope is often a good night's sleep.  Go to bed and get up on time allowing yourself time for appointments without being rushed.  Take a short "time-out" period from any stressful situation that occurs during the day. Close your eyes and take some deep breaths. Starting with the muscles in your face, tense them, hold it for a few seconds, then relax. Repeat this with the muscles in your neck, shoulders, hand, stomach, back and legs.  Take good care of yourself. Eat a balanced diet and get plenty of rest.  Schedule time for having fun. Take a break from your daily routine to relax. HOME CARE INSTRUCTIONS   Call if you feel overwhelmed by your problems and feel you can no longer manage them on your own.  Return immediately if you feel like hurting yourself or someone else. Document Released: 05/16/2001 Document Revised: 02/12/2012 Document Reviewed: 01/06/2008 ExitCare Patient Information 2013 ExitCare, LLC.   

## 2017-10-23 NOTE — Progress Notes (Signed)
21 y.o. G0P0 Single  Caucasian Fe here for annual exam. Stopped OCP. Now periods are heavier and longer and with more cramping( like they were prior to OCP).  Not sexually active with intercourse just hand stimulation and oral stimulation. Some discomfort with this occurred so has stopped. Does not desire contraception, plans to wait until later. 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 No health issues today.  Patient's last menstrual period was 09/22/2017 (approximate).          Sexually active: No.  The current method of family planning is abstinence.    Exercising: No.  exercise Smoker:  no  Health Maintenance: Pap:  History of Abnormal Pap: no MMG:  none Self Breast exams: yes Colonoscopy:  none BMD:   none TDaP:  2010 Shingles: no Pneumonia: no Hep C and HIV: both neg 2016 Labs: poct upt-neg, poct urine-neg   reports that  has never smoked. she has never used smokeless tobacco. She reports that she drinks about 0.6 oz of alcohol per week. She reports that she does not use drugs.  Past Medical History:  Diagnosis Date  . Neurocardiogenic syncope     History reviewed. No pertinent surgical history.  Current Outpatient Medications  Medication Sig Dispense Refill  . ibuprofen (ADVIL,MOTRIN) 200 MG tablet Take 600 mg by mouth every 6 (six) hours as needed for mild pain.     No current facility-administered medications for this visit.     Family History  Problem Relation Age of Onset  . Stroke Maternal Grandmother   . Cancer Maternal Grandfather   . Diabetes Maternal Grandfather   . Stroke Maternal Grandfather   . Heart attack Maternal Grandfather   . Breast cancer Paternal Grandmother 60    ROS:  Pertinent items are noted in HPI.  Otherwise, a comprehensive ROS was negative.  Exam:   BP 94/62   Pulse 64   Resp 16   Ht 5' 4.5" (1.638 m)   Wt 105 lb (47.6 kg)   LMP 09/22/2017 (Approximate) Comment: unsure if date is correct  BMI 17.74 kg/m  Height: 5' 4.5"  (163.8 cm) Ht Readings from Last 3 Encounters:  10/23/17 5' 4.5" (1.638 m)  11/03/16 5\' 4"  (1.626 m)  07/28/16 5\' 4"  (1.626 m)    General appearance: alert, cooperative and appears stated age Head: Normocephalic, without obvious abnormality, atraumatic Neck: no adenopathy, supple, symmetrical, trachea midline and thyroid normal to inspection and palpation Lungs: clear to auscultation bilaterally Breasts: normal appearance, no masses or tenderness, No nipple retraction or dimpling, No nipple discharge or bleeding, No axillary or supraclavicular adenopathy Heart: regular rate and rhythm Abdomen: soft, non-tender; no masses,  no organomegaly Extremities: extremities normal, atraumatic, no cyanosis or edema Skin: Skin color, texture, turgor normal. No rashes or lesions Lymph nodes: Cervical, supraclavicular, and axillary nodes normal. No abnormal inguinal nodes palpated Neurologic: Grossly normal   Pelvic: External genitalia:  no lesions              Urethra:  normal appearing urethra with no masses, tenderness or lesions              Bartholin's and Skene's: normal                 Vagina: normal appearing vagina with normal color and discharge, no lesions              Cervix: no cervical motion tenderness, no lesions and nulliparous appearance  Pap taken: Yes.   Bimanual Exam:  Uterus:  normal size, contour, position, consistency, mobility, non-tender and anteverted              Adnexa: normal adnexa and no mass, fullness, tenderness               Rectovaginal: Confirms               Anus:  normal appearance  Chaperone present: yes  A:  Well Woman with normal exam  Contraception none needed  P:   Reviewed health and wellness pertinent to exam  Pap smear: yes   counseled on breast self exam, STD prevention, HIV risk factors and prevention, adequate intake of calcium and vitamin D, diet and exercise  return annually or prn  An After Visit Summary was printed and  given to the patient.

## 2017-10-26 LAB — CYTOLOGY - PAP: DIAGNOSIS: NEGATIVE

## 2018-07-04 ENCOUNTER — Emergency Department: Payer: 59

## 2018-07-04 ENCOUNTER — Encounter: Payer: Self-pay | Admitting: Emergency Medicine

## 2018-07-04 ENCOUNTER — Emergency Department
Admission: EM | Admit: 2018-07-04 | Discharge: 2018-07-04 | Disposition: A | Discharge status: Home / Self Care | Payer: 59 | Attending: Emergency Medicine | Admitting: Emergency Medicine

## 2018-07-04 ENCOUNTER — Other Ambulatory Visit: Payer: Self-pay

## 2018-07-04 DIAGNOSIS — R079 Chest pain, unspecified: Secondary | ICD-10-CM | POA: Diagnosis not present

## 2018-07-04 DIAGNOSIS — E860 Dehydration: Secondary | ICD-10-CM | POA: Diagnosis not present

## 2018-07-04 DIAGNOSIS — H8110 Benign paroxysmal vertigo, unspecified ear: Secondary | ICD-10-CM | POA: Diagnosis not present

## 2018-07-04 DIAGNOSIS — R42 Dizziness and giddiness: Secondary | ICD-10-CM

## 2018-07-04 LAB — CBC
HEMATOCRIT: 38.1 % (ref 35.0–47.0)
Hemoglobin: 13.1 g/dL (ref 12.0–16.0)
MCH: 28.2 pg (ref 26.0–34.0)
MCHC: 34.3 g/dL (ref 32.0–36.0)
MCV: 82.1 fL (ref 80.0–100.0)
PLATELETS: 254 10*3/uL (ref 150–440)
RBC: 4.64 MIL/uL (ref 3.80–5.20)
RDW: 14.1 % (ref 11.5–14.5)
WBC: 5.6 10*3/uL (ref 3.6–11.0)

## 2018-07-04 LAB — TROPONIN I: Troponin I: <0.03 ng/mL (ref <0.03)

## 2018-07-04 LAB — BASIC METABOLIC PANEL
Anion gap: 4 — ABNORMAL LOW (ref 5–15)
BUN: 10 mg/dL (ref 6–20)
CALCIUM: 9.4 mg/dL (ref 8.9–10.3)
CO2: 29 mmol/L (ref 22–32)
CREATININE: 0.61 mg/dL (ref 0.44–1.00)
Chloride: 109 mmol/L (ref 98–111)
GFR calc Af Amer: >60 mL/min (ref >60)
GLUCOSE: 101 mg/dL — AB (ref 70–99)
POTASSIUM: 4.1 mmol/L (ref 3.5–5.1)
SODIUM: 142 mmol/L (ref 135–145)

## 2018-07-04 LAB — POC URINE PREG, ED: PREG TEST UR: NEGATIVE

## 2018-07-04 NOTE — ED Triage Notes (Signed)
PT to ED from [facility] with c/o CP. PT hx of neurocardiogenic syncope. PT states she became lightheaded at work while standing today. VSS , NAD noted

## 2018-07-04 NOTE — ED Provider Notes (Signed)
[facility] Emergency Department Provider Note  ____________________________________________  Time seen: Approximately 4:05 PM  I have reviewed the triage vital signs and the nursing notes.   HISTORY  Chief Complaint Chest Pain    HPI Natalie [name] is a 22 y.o. female who complains of vague central chest pain described as tightness nonradiating no aggravating or alleviating factors not pleuritic or exertional.  She reports she was at work today when she got lightheaded.  She did not pass out but had to sit down.  She has not been drinking fluids over the last few days.     Past Medical History:  Diagnosis Date  . Neurocardiogenic syncope      Patient Active Problem List   Diagnosis Date Noted  . Menorrhagia with regular cycle 07/28/2016    Class: Diagnosis of     History reviewed. No pertinent surgical history.   Prior to Admission medications   Medication Sig Start Date End Date Taking? Authorizing Provider  ibuprofen (ADVIL,MOTRIN) 200 MG tablet Take 600 mg by mouth every 6 (six) hours as needed for mild pain.    [provider]     Allergies Other; Shellfish allergy; Amoxicillin; Cinnamon; Penicillins; and Sulfa antibiotics   Family History  Problem Relation Age of Onset  . Stroke Maternal Grandmother   . Cancer Maternal Grandfather   . Diabetes Maternal Grandfather   . Stroke Maternal Grandfather   . Heart attack Maternal Grandfather   . Breast cancer Paternal Grandmother 25    Social History Social History   Tobacco Use  . Smoking status: Never Smoker  . Smokeless tobacco: Never Used  Substance Use Topics  . Alcohol use: Yes    Alcohol/week: 0.6 oz    Types: 1 Standard drinks or equivalent per week  . Drug use: No    Review of Systems  Constitutional:   No fever or chills.  ENT:   No sore throat. No rhinorrhea. Cardiovascular:   Positive as above for chest pain without syncope. Respiratory:   No dyspnea or  cough. Gastrointestinal:   Negative for abdominal pain, vomiting and diarrhea.  Musculoskeletal:   Negative for focal pain or swelling All other systems reviewed and are negative except as documented above in ROS and HPI.  ____________________________________________   PHYSICAL EXAM:  VITAL SIGNS: ED Triage Vitals  Enc Vitals Group     BP 07/04/18 1452 100/66     Pulse Rate 07/04/18 1452 64     Resp 07/04/18 1452 16     Temp 07/04/18 1452 98.4 F (36.9 C)     Temp Source 07/04/18 1452 Oral     SpO2 07/04/18 1452 99 %     Weight 07/04/18 1453 110 lb (49.9 kg)     Height 07/04/18 1453 5\' 4"  (1.626 m)     Head Circumference --      Peak Flow --      Pain Score 07/04/18 1452 0     Pain Loc --      Pain Edu? --      Excl. in GC? --     Vital signs reviewed, nursing assessments reviewed.   Constitutional:   Alert and oriented. Non-toxic appearance. Eyes:   Conjunctivae are normal. EOMI. PERRL. ENT      Head:   Normocephalic and atraumatic.      Nose:   No congestion/rhinnorhea.       Mouth/Throat:   Dry mucous membranes, no pharyngeal erythema. No peritonsillar mass.  Neck:   No meningismus. Full ROM. Hematological/Lymphatic/Immunilogical:   No cervical lymphadenopathy. Cardiovascular:   RRR. Symmetric bilateral radial and DP pulses.  No murmurs. Cap refill less than 2 seconds. Respiratory:   Normal respiratory effort without tachypnea/retractions. Breath sounds are clear and equal bilaterally. No wheezes/rales/rhonchi. Gastrointestinal:   Soft and nontender. Non distended. There is no CVA tenderness.  No rebound, rigidity, or guarding. Genitourinary:   deferred Musculoskeletal:   Normal range of motion in all extremities. No joint effusions.  No lower extremity tenderness.  No edema. Neurologic:   Normal speech and language.  Motor grossly intact. No acute focal neurologic deficits are appreciated.  Skin:    Skin is warm, dry and intact. No rash noted.  No petechiae,  purpura, or bullae.  ____________________________________________    LABS (pertinent positives/negatives) (all labs ordered are listed, but only abnormal results are displayed) Labs Reviewed  BASIC METABOLIC PANEL - Abnormal; Notable for the following components:      Result Value   Glucose, Bld 101 (*)    Anion gap 4 (*)    All other components within normal limits  CBC  TROPONIN I  POC URINE PREG, ED   ____________________________________________   EKG  Interpreted by me Sinus rhythm rate of 66, right axis, slightly short PR interval.  Normal QRS ST segments and T waves.  No delta waves.  No evidence of underlying dysrhythmia.  ____________________________________________    RADIOLOGY  Dg Chest 2 View  Result Date: 07/04/2018 CLINICAL DATA:  Chest pain. EXAM: CHEST - 2 VIEW COMPARISON:  Radiographs of January 08, 2014. FINDINGS: The heart size and mediastinal contours are within normal limits. Both lungs are clear. No pneumothorax or pleural effusion is noted. The visualized skeletal structures are unremarkable. IMPRESSION: No active cardiopulmonary disease. Electronically Signed   By: Lupita RaiderJames  Green Jr, M.D.   On: 07/04/2018 15:17    ____________________________________________   PROCEDURES Procedures  ____________________________________________    CLINICAL IMPRESSION / ASSESSMENT AND PLAN / ED COURSE  Pertinent labs & imaging results that were available during my care of the patient were reviewed by me and considered in my medical decision making (see chart for details).    Patient presents with intermittent lightheadedness with standing associated with a brief focal central chest tightness without worrisome symptoms, atypical for ACS PE dissection pneumothorax or pneumonia.  Vital signs are normal, labs EKG and chest x-ray are all normal.  Exam is normal.  Orthostatic symptoms likely due to mild dehydration in the setting of her self-reported low fluid intake  and sports and the hot humid climate here in the summer.  Counseled on increased fluid and salt intake.  Reports that currently she has no symptoms.  Stable for discharge home.      ____________________________________________   FINAL CLINICAL IMPRESSION(S) / ED DIAGNOSES    Final diagnoses:  Orthostatic dizziness  Nonspecific chest pain  Dehydration     ED Discharge Orders    None      Portions of this note were generated with dragon dictation software. Dictation errors may occur despite best attempts at proofreading.    Sharman CheekStafford, Serai Tukes, MD 07/09/18 2051

## 2018-08-29 ENCOUNTER — Telehealth: Payer: Self-pay | Admitting: Certified Nurse Midwife

## 2018-08-29 ENCOUNTER — Encounter: Payer: Self-pay | Admitting: Certified Nurse Midwife

## 2018-08-29 ENCOUNTER — Ambulatory Visit (INDEPENDENT_AMBULATORY_CARE_PROVIDER_SITE_OTHER): Payer: 59 | Admitting: Certified Nurse Midwife

## 2018-08-29 VITALS — BP 118/84 | HR 70 | Temp 97.9°F | Resp 16 | Ht 64.5 in | Wt 113.0 lb

## 2018-08-29 DIAGNOSIS — Z01419 Encounter for gynecological examination (general) (routine) without abnormal findings: Secondary | ICD-10-CM

## 2018-08-29 DIAGNOSIS — N926 Irregular menstruation, unspecified: Secondary | ICD-10-CM | POA: Diagnosis not present

## 2018-08-29 DIAGNOSIS — N915 Oligomenorrhea, unspecified: Secondary | ICD-10-CM

## 2018-08-29 LAB — POCT URINE PREGNANCY: PREG TEST UR: NEGATIVE

## 2018-08-29 NOTE — Progress Notes (Signed)
22 y.o. Single Caucasian female G0P0 here with complaint of vaginal symptoms of very watery and feels hot to her and increase discharge of what she usually has prior to period. Describes discharge as non odorous.. Onset of symptoms in the today. Denies new personal products. Not sexually active at present. No STD concerns. Urinary symptoms none . Contraception condoms if sexually active.Just had never had period onset like this, concerned.No other health issues today.. Plans to go back to school spring for nursing.  Review of Systems  Constitutional: Negative.   HENT: Negative.   Eyes: Negative.   Respiratory: Negative.   Cardiovascular: Negative.   Gastrointestinal: Positive for nausea.  Genitourinary:       Vaginal discharge, menstrual cycle changes  Musculoskeletal: Negative.   Skin: Negative.   Neurological: Negative.   Endo/Heme/Allergies: Negative.   Psychiatric/Behavioral: Negative.     O:Healthy female WDWN Affect: normal, orientation x 3  Exam:Skin: warm and dry Abdomen: soft, non tender, no masses  Inguinal Lymph nodes: no enlargement or tenderness Pelvic exam: External genital: normal female BUS: negative Vagina: menses appearing discharge noted. Cervix: normal, non tender, no CMT Uterus: normal, non tender Adnexa:normal, non tender, no masses or fullness noted. POCT UPT negative   Last sexual activity 8-9 months ago  A:Normal pelvic exam Normal menstrual color and flow noted with exam   P:Discussed findings of normal pelvic exam and normal appearing menstrual flow.Discussed normal shedding of uterine lining with period and variations depending on her hormonal balance . Questions addressed. Patient felt relieved after discussion of normal and abnormal parameters  with menses.   Rv prn

## 2018-08-29 NOTE — Telephone Encounter (Signed)
Spoke with patient. Patient reports watery, "warm" vaginal d/c one wk prior to menses starting. Menses started today. Is concerned, started "clotted and orange", has now become more regular. Reports intermittent pelvic pain and nausea for the past few wks. Denies urinary symptoms, vaginal odor, burning, itching. Patient states she has not been SA recently, denies STD concerns. No contraceptive.   OV scheduled for today at 3:45pm with Leota Sauers, CNM.  Patient verbalizes understanding and is agreeable. Encounter closed.

## 2018-08-29 NOTE — Telephone Encounter (Signed)
Patient would like to speak with a nurse and possibly be seen. Started cycle today and it was clotted and bright orange. For a week prior to starting, was having watery discharge. All of which is abnormal for her.

## 2018-11-12 ENCOUNTER — Ambulatory Visit (INDEPENDENT_AMBULATORY_CARE_PROVIDER_SITE_OTHER): Payer: 59 | Admitting: Certified Nurse Midwife

## 2018-11-12 ENCOUNTER — Ambulatory Visit: Payer: 59 | Admitting: Certified Nurse Midwife

## 2018-11-12 ENCOUNTER — Other Ambulatory Visit: Payer: Self-pay

## 2018-11-12 ENCOUNTER — Telehealth: Payer: Self-pay | Admitting: Certified Nurse Midwife

## 2018-11-12 ENCOUNTER — Encounter: Payer: Self-pay | Admitting: Certified Nurse Midwife

## 2018-11-12 VITALS — BP 98/60 | HR 60 | Resp 16 | Ht 64.75 in | Wt 110.0 lb

## 2018-11-12 DIAGNOSIS — Z659 Problem related to unspecified psychosocial circumstances: Secondary | ICD-10-CM

## 2018-11-12 DIAGNOSIS — Z01419 Encounter for gynecological examination (general) (routine) without abnormal findings: Secondary | ICD-10-CM

## 2018-11-12 NOTE — Progress Notes (Signed)
22 y.o. G0P0 Single  Caucasian Fe here for annual exam. Periods normal, no issues. Never sexually active. Ended relationship with boyfriend who attends her church group. Having emotional issues concerning this sudden break up. Mother supportive and patient leaning on her beliefs to get her through this. Never sexually active, so no STD concerns or testing. No other health concerns today.  Patient's last menstrual period was 10/20/2018 (exact date).          Sexually active: No.  The current method of family planning is abstinence.    Exercising: No.  exercise Smoker:  no  Review of Systems  Constitutional: Negative.   Eyes: Negative.   Respiratory: Negative.   Cardiovascular: Negative.   Gastrointestinal: Negative.   Genitourinary:       Menstrual cycle changes  Musculoskeletal: Negative.   Skin: Negative.   Neurological: Negative.   Endo/Heme/Allergies: Negative.   Psychiatric/Behavioral: Negative.     Health Maintenance: Pap:  10-23-17 neg History of Abnormal Pap: no MMG:  none Self Breast exams: yes Colonoscopy:  none BMD:   none TDaP:  2010 Shingles: no Pneumonia: no Hep C and HIV: both neg 2016 Labs: if needed   reports that she has never smoked. She has never used smokeless tobacco. She reports that she drank alcohol. She reports that she does not use drugs.  Past Medical History:  Diagnosis Date  . Neurocardiogenic syncope     History reviewed. No pertinent surgical history.  Current Outpatient Medications  Medication Sig Dispense Refill  . ibuprofen (ADVIL,MOTRIN) 200 MG tablet Take 600 mg by mouth every 6 (six) hours as needed for mild pain.     No current facility-administered medications for this visit.     Family History  Problem Relation Age of Onset  . Stroke Maternal Grandmother   . Cancer Maternal Grandfather   . Diabetes Maternal Grandfather   . Stroke Maternal Grandfather   . Heart attack Maternal Grandfather   . Breast cancer Paternal  Grandmother 60    ROS:  Pertinent items are noted in HPI.  Otherwise, a comprehensive ROS was negative.  Exam:   BP 98/60   Pulse 60   Resp 16   Ht 5' 4.75" (1.645 m)   Wt 110 lb (49.9 kg)   LMP 10/20/2018 (Exact Date)   BMI 18.45 kg/m  Height: 5' 4.75" (164.5 cm) Ht Readings from Last 3 Encounters:  11/12/18 5' 4.75" (1.645 m)  08/29/18 5' 4.5" (1.638 m)  07/04/18 5\' 4"  (1.626 m)    General appearance: alert, cooperative and appears stated age Head: Normocephalic, without obvious abnormality, atraumatic Neck: no adenopathy, supple, symmetrical, trachea midline and thyroid normal to inspection and palpation Lungs: clear to auscultation bilaterally Breasts: normal appearance, no masses or tenderness, No nipple retraction or dimpling, No nipple discharge or bleeding, No axillary or supraclavicular adenopathy Heart: regular rate and rhythm Abdomen: soft, non-tender; no masses,  no organomegaly Extremities: extremities normal, atraumatic, no cyanosis or edema Skin: Skin color, texture, turgor normal. No rashes or lesions Lymph nodes: Cervical, supraclavicular, and axillary nodes normal. No abnormal inguinal nodes palpated Neurologic: Grossly normal   Pelvic: External genitalia:  no lesions              Urethra:  normal appearing urethra with no masses, tenderness or lesions              Bartholin's and Skene's: normal  Vagina: normal appearing vagina with normal color and discharge, no lesions              Cervix: no cervical motion tenderness, no lesions and nulliparous appearance              Pap taken: No. Bimanual Exam:  Uterus:  normal size, contour, position, consistency, mobility, non-tender and anteverted              Adnexa: normal adnexa and no mass, fullness, tenderness               Rectovaginal: Confirms               Anus:  normal sphincter tone, no lesions  Chaperone present: yes  A:  Well Woman with normal exam  Contraception none, never  sexually active  Social stress with ended relationship  P:   Reviewed health and wellness pertinent to exam  Will advise if needs contraception, plans no sexual activity until marriage  Discussed seeking support from mother and friends during this time of change in relationship  Pap smear: no   counseled on breast self exam, STD prevention, HIV risk factors and prevention, adequate intake of calcium and vitamin D, diet and exercise  return annually or prn  An After Visit Summary was printed and given to the patient.

## 2018-11-12 NOTE — Progress Notes (Deleted)
22 y.o. G0P0 Single  {Race/ethnicity:17218} Fe here for annual exam.    No LMP recorded.          Sexually active: {yes no:314532}  The current method of family planning is {contraception:315051}.    Exercising: {yes no:314532}  {types:19826} Smoker:  {YES NO:22349}  ROS  Health Maintenance: Pap:  10-23-17 neg History of Abnormal Pap: {YES NO:22349} MMG:  none Self Breast exams: {YES NO:22349} Colonoscopy:  none BMD:   none TDaP:  2010 Shingles: no Pneumonia: no Hep C and HIV: both neg 2016 Labs: ***   reports that she has never smoked. She has never used smokeless tobacco. She reports that she drinks alcohol. She reports that she does not use drugs.  Past Medical History:  Diagnosis Date  . Neurocardiogenic syncope     No past surgical history on file.  Current Outpatient Medications  Medication Sig Dispense Refill  . ibuprofen (ADVIL,MOTRIN) 200 MG tablet Take 600 mg by mouth every 6 (six) hours as needed for mild pain.     No current facility-administered medications for this visit.     Family History  Problem Relation Age of Onset  . Stroke Maternal Grandmother   . Cancer Maternal Grandfather   . Diabetes Maternal Grandfather   . Stroke Maternal Grandfather   . Heart attack Maternal Grandfather   . Breast cancer Paternal Grandmother 60    ROS:  Pertinent items are noted in HPI.  Otherwise, a comprehensive ROS was negative.  Exam:   There were no vitals taken for this visit.   Ht Readings from Last 3 Encounters:  08/29/18 5' 4.5" (1.638 m)  07/04/18 5\' 4"  (1.626 m)  10/23/17 5' 4.5" (1.638 m)    General appearance: alert, cooperative and appears stated age Head: Normocephalic, without obvious abnormality, atraumatic Neck: no adenopathy, supple, symmetrical, trachea midline and thyroid {EXAM; THYROID:18604} Lungs: clear to auscultation bilaterally Breasts: {Exam; breast:13139::"normal appearance, no masses or tenderness"} Heart: regular rate and  rhythm Abdomen: soft, non-tender; no masses,  no organomegaly Extremities: extremities normal, atraumatic, no cyanosis or edema Skin: Skin color, texture, turgor normal. No rashes or lesions Lymph nodes: Cervical, supraclavicular, and axillary nodes normal. No abnormal inguinal nodes palpated Neurologic: Grossly normal   Pelvic: External genitalia:  no lesions              Urethra:  normal appearing urethra with no masses, tenderness or lesions              Bartholin's and Skene's: normal                 Vagina: normal appearing vagina with normal color and discharge, no lesions              Cervix: {exam; cervix:14595}              Pap taken: {yes no:314532} Bimanual Exam:  Uterus:  {exam; uterus:12215}              Adnexa: {exam; adnexa:12223}               Rectovaginal: Confirms               Anus:  normal sphincter tone, no lesions  Chaperone present: ***  A:  Well Woman with normal exam  P:   Reviewed health and wellness pertinent to exam  Pap smear: {YES NO:22349}  {plan; gyn:5269::"mammogram","pap smear","return annually or prn"}  An After Visit Summary was printed and given to the patient.

## 2018-11-12 NOTE — Patient Instructions (Signed)
General topics  Next pap or exam is  due in 1 year Take a Women's multivitamin Take 1200 mg. of calcium daily - prefer dietary If any concerns in interim to call back  Breast Self-Awareness Practicing breast self-awareness may pick up problems early, prevent significant medical complications, and possibly save your life. By practicing breast self-awareness, you can become familiar with how your breasts look and feel and if your breasts are changing. This allows you to notice changes early. It can also offer you some reassurance that your breast health is good. One way to learn what is normal for your breasts and whether your breasts are changing is to do a breast self-exam. If you find a lump or something that was not present in the past, it is best to contact your caregiver right away. Other findings that should be evaluated by your caregiver include nipple discharge, especially if it is bloody; skin changes or reddening; areas where the skin seems to be pulled in (retracted); or new lumps and bumps. Breast pain is seldom associated with cancer (malignancy), but should also be evaluated by a caregiver. BREAST SELF-EXAM The best time to examine your breasts is 5 7 days after your menstrual period is over.  ExitCare Patient Information 2013 [employer].   Exercise to Stay Healthy Exercise helps you become and stay healthy. EXERCISE IDEAS AND TIPS Choose exercises that:  You enjoy.  Fit into your day. You do not need to exercise really hard to be healthy. You can do exercises at a slow or medium level and stay healthy. You can:  Stretch before and after working out.  Try yoga, Pilates, or tai chi.  Lift weights.  Walk fast, swim, jog, run, climb stairs, bicycle, dance, or rollerskate.  Take aerobic classes. Exercises that burn about 150 calories:  Running 1  miles in 15 minutes.  Playing volleyball for 45 to 60 minutes.  Washing and waxing a car for 45 to 60  minutes.  Playing touch football for 45 minutes.  Walking 1  miles in 35 minutes.  Pushing a stroller 1  miles in 30 minutes.  Playing basketball for 30 minutes.  Raking leaves for 30 minutes.  Bicycling 5 miles in 30 minutes.  Walking 2 miles in 30 minutes.  Dancing for 30 minutes.  Shoveling snow for 15 minutes.  Swimming laps for 20 minutes.  Walking up stairs for 15 minutes.  Bicycling 4 miles in 15 minutes.  Gardening for 30 to 45 minutes.  Jumping rope for 15 minutes.  Washing windows or floors for 45 to 60 minutes. Document Released: 12/23/2010 Document Revised: 02/12/2012 Document Reviewed: 12/23/2010 ExitCare Patient Information 2013 [employer].   Other topics ( that may be useful information):    Sexually Transmitted Disease Sexually transmitted disease (STD) refers to any infection that is passed from person to person during sexual activity. This may happen by way of saliva, semen, blood, vaginal mucus, or urine. Common STDs include:  Gonorrhea.  Chlamydia.  Syphilis.  HIV/AIDS.  Genital herpes.  Hepatitis B and C.  Trichomonas.  Human papillomavirus (HPV).  Pubic lice. CAUSES  An STD may be spread by bacteria, virus, or parasite. A person can get an STD by:  Sexual intercourse with an infected person.  Sharing sex toys with an infected person.  Sharing needles with an infected person.  Having intimate contact with the genitals, mouth, or rectal areas of an infected person. SYMPTOMS  Some people may not have any symptoms, but   they can still pass the infection to others. Different STDs have different symptoms. Symptoms include:  Painful or bloody urination.  Pain in the pelvis, abdomen, vagina, anus, throat, or eyes.  Skin rash, itching, irritation, growths, or sores (lesions). These usually occur in the genital or anal area.  Abnormal vaginal discharge.  Penile discharge in men.  Soft, flesh-colored skin growths in the  genital or anal area.  Fever.  Pain or bleeding during sexual intercourse.  Swollen glands in the groin area.  Yellow skin and eyes (jaundice). This is seen with hepatitis. DIAGNOSIS  To make a diagnosis, your caregiver may:  Take a medical history.  Perform a physical exam.  Take a specimen (culture) to be examined.  Examine a sample of discharge under a microscope.  Perform blood test TREATMENT   Chlamydia, gonorrhea, trichomonas, and syphilis can be cured with antibiotic medicine.  Genital herpes, hepatitis, and HIV can be treated, but not cured, with prescribed medicines. The medicines will lessen the symptoms.  Genital warts from HPV can be treated with medicine or by freezing, burning (electrocautery), or surgery. Warts may come back.  HPV is a virus and cannot be cured with medicine or surgery.However, abnormal areas may be followed very closely by your caregiver and may be removed from the cervix, vagina, or vulva through office procedures or surgery. If your diagnosis is confirmed, your recent sexual partners need treatment. This is true even if they are symptom-free or have a negative culture or evaluation. They should not have sex until their caregiver says it is okay. HOME CARE INSTRUCTIONS  All sexual partners should be informed, tested, and treated for all STDs.  Take your antibiotics as directed. Finish them even if you start to feel better.  Only take over-the-counter or prescription medicines for pain, discomfort, or fever as directed by your caregiver.  Rest.  Eat a balanced diet and drink enough fluids to keep your urine clear or pale yellow.  Do not have sex until treatment is completed and you have followed up with your caregiver. STDs should be checked after treatment.  Keep all follow-up appointments, Pap tests, and blood tests as directed by your caregiver.  Only use latex condoms and water-soluble lubricants during sexual activity. Do not use  petroleum jelly or oils.  Avoid alcohol and illegal drugs.  Get vaccinated for HPV and hepatitis. If you have not received these vaccines in the past, talk to your caregiver about whether one or both might be right for you.  Avoid risky sex practices that can break the skin. The only way to avoid getting an STD is to avoid all sexual activity.Latex condoms and dental dams (for oral sex) will help lessen the risk of getting an STD, but will not completely eliminate the risk. SEEK MEDICAL CARE IF:   You have a fever.  You have any new or worsening symptoms. Document Released: 02/10/2003 Document Revised: 02/12/2012 Document Reviewed: 02/17/2011 [facility] Patient Information 2013 [employer].    Domestic Abuse You are being battered or abused if someone close to you hits, pushes, or physically hurts you in any way. You also are being abused if you are forced into activities. You are being sexually abused if you are forced to have sexual contact of any kind. You are being emotionally abused if you are made to feel worthless or if you are constantly threatened. It is important to remember that help is available. No one has the right to abuse you. PREVENTION OF FURTHER  ABUSE  Learn the warning signs of danger. This varies with situations but may include: the use of alcohol, threats, isolation from friends and family, or forced sexual contact. Leave if you feel that violence is going to occur.  If you are attacked or beaten, report it to the police so the abuse is documented. You do not have to press charges. The police can protect you while you or the attackers are leaving. Get the officer's name and badge number and a copy of the report.  Find someone you can trust and tell them what is happening to you: your caregiver, a nurse, clergy member, close friend or family member. Feeling ashamed is natural, but remember that you have done nothing wrong. No one deserves abuse. Document Released:  11/17/2000 Document Revised: 02/12/2012 Document Reviewed: 01/26/2011 ExitCare Patient Information 2013 [employer].    How Much is Too Much Alcohol? Drinking too much alcohol can cause injury, accidents, and health problems. These types of problems can include:   Car crashes.  Falls.  Family fighting (domestic violence).  Drowning.  Fights.  Injuries.  Burns.  Damage to certain organs.  Having a baby with birth defects. ONE DRINK CAN BE TOO MUCH WHEN YOU ARE:  Working.  Pregnant or breastfeeding.  Taking medicines. Ask your doctor.  Driving or planning to drive. If you or someone you know has a drinking problem, get help from a doctor.  Document Released: 09/16/2009 Document Revised: 02/12/2012 Document Reviewed: 09/16/2009 ExitCare Patient Information 2013 [employer].   Smoking Hazards Smoking cigarettes is extremely bad for your health. Tobacco smoke has over 200 known poisons in it. There are over 60 chemicals in tobacco smoke that cause cancer. Some of the chemicals found in cigarette smoke include:   Cyanide.  Benzene.  Formaldehyde.  Methanol (wood alcohol).  Acetylene (fuel used in welding torches).  Ammonia. Cigarette smoke also contains the poisonous gases nitrogen oxide and carbon monoxide.  Cigarette smokers have an increased risk of many serious medical problems and Smoking causes approximately:  90% of all lung cancer deaths in men.  80% of all lung cancer deaths in women.  90% of deaths from chronic obstructive lung disease. Compared with nonsmokers, smoking increases the risk of:  Coronary heart disease by 2 to 4 times.  Stroke by 2 to 4 times.  Men developing lung cancer by 23 times.  Women developing lung cancer by 13 times.  Dying from chronic obstructive lung diseases by 12 times.  . Smoking is the most preventable cause of death and disease in our society.  WHY IS SMOKING ADDICTIVE?  Nicotine is the chemical  agent in tobacco that is capable of causing addiction or dependence.  When you smoke and inhale, nicotine is absorbed rapidly into the bloodstream through your lungs. Nicotine absorbed through the lungs is capable of creating a powerful addiction. Both inhaled and non-inhaled nicotine may be addictive.  Addiction studies of cigarettes and spit tobacco show that addiction to nicotine occurs mainly during the teen years, when young people begin using tobacco products. WHAT ARE THE BENEFITS OF QUITTING?  There are many health benefits to quitting smoking.   Likelihood of developing cancer and heart disease decreases. Health improvements are seen almost immediately.  Blood pressure, pulse rate, and breathing patterns start returning to normal soon after quitting. QUITTING SMOKING   American Lung Association - 1-800-LUNGUSA  American Cancer Society - 1-800-ACS-2345 Document Released: 12/28/2004 Document Revised: 02/12/2012 Document Reviewed: 09/01/2009 ExitCare Patient Information 2013 [employer].   Stress Management Stress is a state of physical or mental tension that often results from changes in your life or normal routine. Some common causes of stress are:  Death of a loved one.  Injuries or severe illnesses.  Getting fired or changing jobs.  Moving into a new home. Other causes may be:  Sexual problems.  Business or financial losses.  Taking on a large debt.  Regular conflict with someone at home or at work.  Constant tiredness from lack of sleep. It is not just bad things that are stressful. It may be stressful to:  Win the lottery.  Get married.  Buy a new car. The amount of stress that can be easily tolerated varies from person to person. Changes generally cause stress, regardless of the types of change. Too much stress can affect your health. It may lead to physical or emotional problems. Too little stress (boredom) may also become stressful. SUGGESTIONS TO  REDUCE STRESS:  Talk things over with your family and friends. It often is helpful to share your concerns and worries. If you feel your problem is serious, you may want to get help from a professional counselor.  Consider your problems one at a time instead of lumping them all together. Trying to take care of everything at once may seem impossible. List all the things you need to do and then start with the most important one. Set a goal to accomplish 2 or 3 things each day. If you expect to do too many in a single day you will naturally fail, causing you to feel even more stressed.  Do not use alcohol or drugs to relieve stress. Although you may feel better for a short time, they do not remove the problems that caused the stress. They can also be habit forming.  Exercise regularly - at least 3 times per week. Physical exercise can help to relieve that "uptight" feeling and will relax you.  The shortest distance between despair and hope is often a good night's sleep.  Go to bed and get up on time allowing yourself time for appointments without being rushed.  Take a short "time-out" period from any stressful situation that occurs during the day. Close your eyes and take some deep breaths. Starting with the muscles in your face, tense them, hold it for a few seconds, then relax. Repeat this with the muscles in your neck, shoulders, hand, stomach, back and legs.  Take good care of yourself. Eat a balanced diet and get plenty of rest.  Schedule time for having fun. Take a break from your daily routine to relax. HOME CARE INSTRUCTIONS   Call if you feel overwhelmed by your problems and feel you can no longer manage them on your own.  Return immediately if you feel like hurting yourself or someone else. Document Released: 05/16/2001 Document Revised: 02/12/2012 Document Reviewed: 01/06/2008 ExitCare Patient Information 2013 [employer].   

## 2018-11-12 NOTE — Telephone Encounter (Signed)
VOID

## 2018-12-11 ENCOUNTER — Emergency Department (HOSPITAL_COMMUNITY)
Admission: EM | Admit: 2018-12-11 | Discharge: 2018-12-11 | Discharge status: Against Medical Advice | Payer: 59 | Attending: Emergency Medicine | Admitting: Emergency Medicine

## 2018-12-11 ENCOUNTER — Emergency Department (HOSPITAL_COMMUNITY): Payer: 59

## 2018-12-11 ENCOUNTER — Encounter (HOSPITAL_COMMUNITY): Payer: Self-pay | Admitting: Emergency Medicine

## 2018-12-11 DIAGNOSIS — Y929 Unspecified place or not applicable: Secondary | ICD-10-CM | POA: Diagnosis not present

## 2018-12-11 DIAGNOSIS — R0781 Pleurodynia: Secondary | ICD-10-CM

## 2018-12-11 DIAGNOSIS — Y939 Activity, unspecified: Secondary | ICD-10-CM | POA: Insufficient documentation

## 2018-12-11 DIAGNOSIS — Z5329 Procedure and treatment not carried out because of patient's decision for other reasons: Secondary | ICD-10-CM | POA: Diagnosis not present

## 2018-12-11 DIAGNOSIS — M546 Pain in thoracic spine: Secondary | ICD-10-CM

## 2018-12-11 DIAGNOSIS — M5489 Other dorsalgia: Secondary | ICD-10-CM | POA: Diagnosis not present

## 2018-12-11 DIAGNOSIS — S299XXA Unspecified injury of thorax, initial encounter: Secondary | ICD-10-CM | POA: Diagnosis present

## 2018-12-11 DIAGNOSIS — Y999 Unspecified external cause status: Secondary | ICD-10-CM | POA: Insufficient documentation

## 2018-12-11 DIAGNOSIS — R0789 Other chest pain: Secondary | ICD-10-CM | POA: Diagnosis not present

## 2018-12-11 LAB — I-STAT BETA HCG BLOOD, ED (MC, WL, AP ONLY): I-stat hCG, quantitative: <5 m[IU]/mL (ref <5)
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
## 2018-12-30 ENCOUNTER — Telehealth: Payer: Self-pay | Admitting: Certified Nurse Midwife
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
## 2019-01-01 ENCOUNTER — Encounter: Payer: Self-pay | Admitting: Obstetrics and Gynecology

## 2019-01-01 ENCOUNTER — Other Ambulatory Visit: Payer: Self-pay

## 2019-01-01 ENCOUNTER — Telehealth: Payer: Self-pay | Admitting: Obstetrics and Gynecology

## 2019-01-01 ENCOUNTER — Ambulatory Visit (INDEPENDENT_AMBULATORY_CARE_PROVIDER_SITE_OTHER): Payer: 59 | Admitting: Obstetrics and Gynecology

## 2019-01-01 VITALS — BP 104/80 | HR 72 | Wt 113.2 lb

## 2019-01-01 DIAGNOSIS — R5383 Other fatigue: Secondary | ICD-10-CM | POA: Diagnosis not present

## 2019-01-01 DIAGNOSIS — F329 Major depressive disorder, single episode, unspecified: Secondary | ICD-10-CM | POA: Diagnosis not present

## 2019-01-01 DIAGNOSIS — R42 Dizziness and giddiness: Secondary | ICD-10-CM | POA: Diagnosis not present

## 2019-01-01 DIAGNOSIS — F419 Anxiety disorder, unspecified: Secondary | ICD-10-CM

## 2019-01-01 DIAGNOSIS — G479 Sleep disorder, unspecified: Secondary | ICD-10-CM

## 2019-01-01 DIAGNOSIS — R110 Nausea: Secondary | ICD-10-CM

## 2019-01-01 MED ORDER — CITALOPRAM HYDROBROMIDE 20 MG PO TABS: ORAL_TABLET | ORAL | 1 refills | Status: DC

## 2019-01-01 NOTE — Telephone Encounter (Signed)
Spoke with patient. Patient will call to have RX transferred. Pharmacy updated for future prescriptions. Patient aware to return call if any further assistance needed.  Patient verbalizes understanding and thankful for return call.   Encounter closed.

## 2019-01-01 NOTE — Patient Instructions (Signed)
Major Depressive Disorder, Adult  Major depressive disorder (MDD) is a mental health condition. It may also be called clinical depression or unipolar depression. MDD usually causes feelings of sadness, hopelessness, or helplessness. MDD can also cause physical symptoms. It can interfere with work, school, relationships, and other everyday activities. MDD may be mild, moderate, or severe. It may occur once (single episode major depressive disorder) or it may occur multiple times (recurrent major depressive disorder).  What are the causes?  The exact cause of this condition is not known. MDD is most likely caused by a combination of things, which may include:   Genetic factors. These are traits that are passed along from parent to child.   Individual factors. Your personality, your behavior, and the way you handle your thoughts and feelings may contribute to MDD. This includes personality traits and behaviors learned from others.   Physical factors, such as:  ? Differences in the part of your brain that controls emotion. This part of your brain may be different than it is in people who do not have MDD.  ? Long-term (chronic) medical or psychiatric illnesses.   Social factors. Traumatic experiences or major life changes may play a role in the development of MDD.  What increases the risk?  This condition is more likely to develop in women. The following factors may also make you more likely to develop MDD:   A family history of depression.   Troubled family relationships.   Abnormally low levels of certain brain chemicals.   Traumatic events in childhood, especially abuse or the loss of a parent.   Being under a lot of stress, or long-term stress, especially from upsetting life experiences or losses.   A history of:  ? Chronic physical illness.  ? Other mental health disorders.  ? Substance abuse.   Poor living conditions.   Experiencing social exclusion or discrimination on a regular basis.  What are the  signs or symptoms?  The main symptoms of MDD typically include:   Constant depressed or irritable mood.   Loss of interest in things and activities.  MDD symptoms may also include:   Sleeping or eating too much or too little.   Unexplained weight change.   Fatigue or low energy.   Feelings of worthlessness or guilt.   Difficulty thinking clearly or making decisions.   Thoughts of suicide or of harming others.   Physical agitation or weakness.   Isolation.  Severe cases of MDD may also occur with other symptoms, such as:   Delusions or hallucinations, in which you imagine things that are not real (psychotic depression).   Low-level depression that lasts at least a year (chronic depression or persistent depressive disorder).   Extreme sadness and hopelessness (melancholic depression).   Trouble speaking and moving (catatonic depression).  How is this diagnosed?  This condition may be diagnosed based on:   Your symptoms.   Your medical history, including your mental health history. This may involve tests to evaluate your mental health. You may be asked questions about your lifestyle, including any drug and alcohol use, and how long you have had symptoms of MDD.   A physical exam.   Blood tests to rule out other conditions.  You must have a depressed mood and at least four other MDD symptoms most of the day, nearly every day in the same 2-week timeframe before your health care provider can confirm a diagnosis of MDD.  How is this treated?  This condition is   usually treated by mental health professionals, such as psychologists, psychiatrists, and clinical social workers. You may need more than one type of treatment. Treatment may include:   Psychotherapy. This is also called talk therapy or counseling. Types of psychotherapy include:  ? Cognitive behavioral therapy (CBT). This type of therapy teaches you to recognize unhealthy feelings, thoughts, and behaviors, and replace them with positive thoughts  and actions.  ? Interpersonal therapy (IPT). This helps you to improve the way you relate to and communicate with others.  ? Family therapy. This treatment includes members of your family.   Medicine to treat anxiety and depression, or to help you control certain emotions and behaviors.   Lifestyle changes, such as:  ? Limiting alcohol and drug use.  ? Exercising regularly.  ? Getting plenty of sleep.  ? Making healthy eating choices.  ? Spending more time outdoors.    Treatments involving stimulation of the brain can be used in situations with extremely severe symptoms, or when medicine or other therapies do not work over time. These treatments include electroconvulsive therapy, transcranial magnetic stimulation, and vagal nerve stimulation.  Follow these instructions at home:  Activity   Return to your normal activities as told by your health care provider.   Exercise regularly and spend time outdoors as told by your health care provider.  General instructions   Take over-the-counter and prescription medicines only as told by your health care provider.   Do not drink alcohol. If you drink alcohol, limit your alcohol intake to no more than 1 drink a day for nonpregnant women and 2 drinks a day for men. One drink equals 12 oz of beer, 5 oz of wine, or 1 oz of hard liquor. Alcohol can affect any antidepressant medicines you are taking. Talk to your health care provider about your alcohol use.   Eat a healthy diet and get plenty of sleep.   Find activities that you enjoy doing, and make time to do them.   Consider joining a support group. Your health care provider may be able to recommend a support group.   Keep all follow-up visits as told by your health care provider. This is important.  Where to find more information  National Alliance on Mental Illness   www.nami.org  U.S. National Institute of Mental Health   www.nimh.nih.gov  National Suicide Prevention Lifeline   1-800-273-TALK (8255). This is  free, 24-hour help.  Contact a health care provider if:   Your symptoms get worse.   You develop new symptoms.  Get help right away if:   You self-harm.   You have serious thoughts about hurting yourself or others.   You see, hear, taste, smell, or feel things that are not present (hallucinate).  This information is not intended to replace advice given to you by your health care provider. Make sure you discuss any questions you have with your health care provider.  Document Released: 03/17/2013 Document Revised: 07/27/2016 Document Reviewed: 05/31/2016  Elsevier Interactive Patient Education  2019 Elsevier Inc[employer]

## 2019-01-01 NOTE — Telephone Encounter (Signed)
Patient saw Dr. Oscar La today and was prescribed Citalopram. She would like to change the pharmacy to the Outpatient pharmacy at [facility] [address] [place], [place], [place] [address].

## 2019-01-02 LAB — CBC
HEMATOCRIT: 39.3 % (ref 34.0–46.6)
Hemoglobin: 13.1 g/dL (ref 11.1–15.9)
MCH: 28.2 pg (ref 26.6–33.0)
MCHC: 33.3 g/dL (ref 31.5–35.7)
MCV: 85 fL (ref 79–97)
Platelets: 249 10*3/uL (ref 150–450)
RBC: 4.65 x10E6/uL (ref 3.77–5.28)
RDW: 12.9 % (ref 11.7–15.4)
WBC: 4.6 10*3/uL (ref 3.4–10.8)

## 2019-01-02 LAB — COMPREHENSIVE METABOLIC PANEL
A/G RATIO: 2 (ref 1.2–2.2)
ALBUMIN: 4.7 g/dL (ref 3.9–5.0)
ALT: 12 IU/L (ref 0–32)
AST: 17 IU/L (ref 0–40)
Alkaline Phosphatase: 58 IU/L (ref 39–117)
BUN/Creatinine Ratio: 18 (ref 9–23)
BUN: 12 mg/dL (ref 6–20)
Bilirubin Total: 1.1 mg/dL (ref 0.0–1.2)
CALCIUM: 9.9 mg/dL (ref 8.7–10.2)
CO2: 25 mmol/L (ref 20–29)
CREATININE: 0.68 mg/dL (ref 0.57–1.00)
Chloride: 102 mmol/L (ref 96–106)
GFR calc Af Amer: 144 mL/min/{1.73_m2} (ref >59)
GFR, EST NON AFRICAN AMERICAN: 125 mL/min/{1.73_m2} (ref >59)
GLOBULIN, TOTAL: 2.4 g/dL (ref 1.5–4.5)
Glucose: 84 mg/dL (ref 65–99)
POTASSIUM: 4.1 mmol/L (ref 3.5–5.2)
SODIUM: 140 mmol/L (ref 134–144)
Total Protein: 7.1 g/dL (ref 6.0–8.5)

## 2019-01-02 LAB — TSH: TSH: 1.5 u[IU]/mL (ref 0.450–4.500)

## 2019-04-13 IMAGING — CR DG CHEST 2V
1 series · 2 of 2 positions shown · non-contrast
Comparison: Radiographs January 08, 2014.

CLINICAL DATA: Chest pain.

EXAM:
CHEST - 2 VIEW

[Series 1: dg chest 2 view · 0.14mm/px · 2 of 2 slices shown]
[im 1/2]
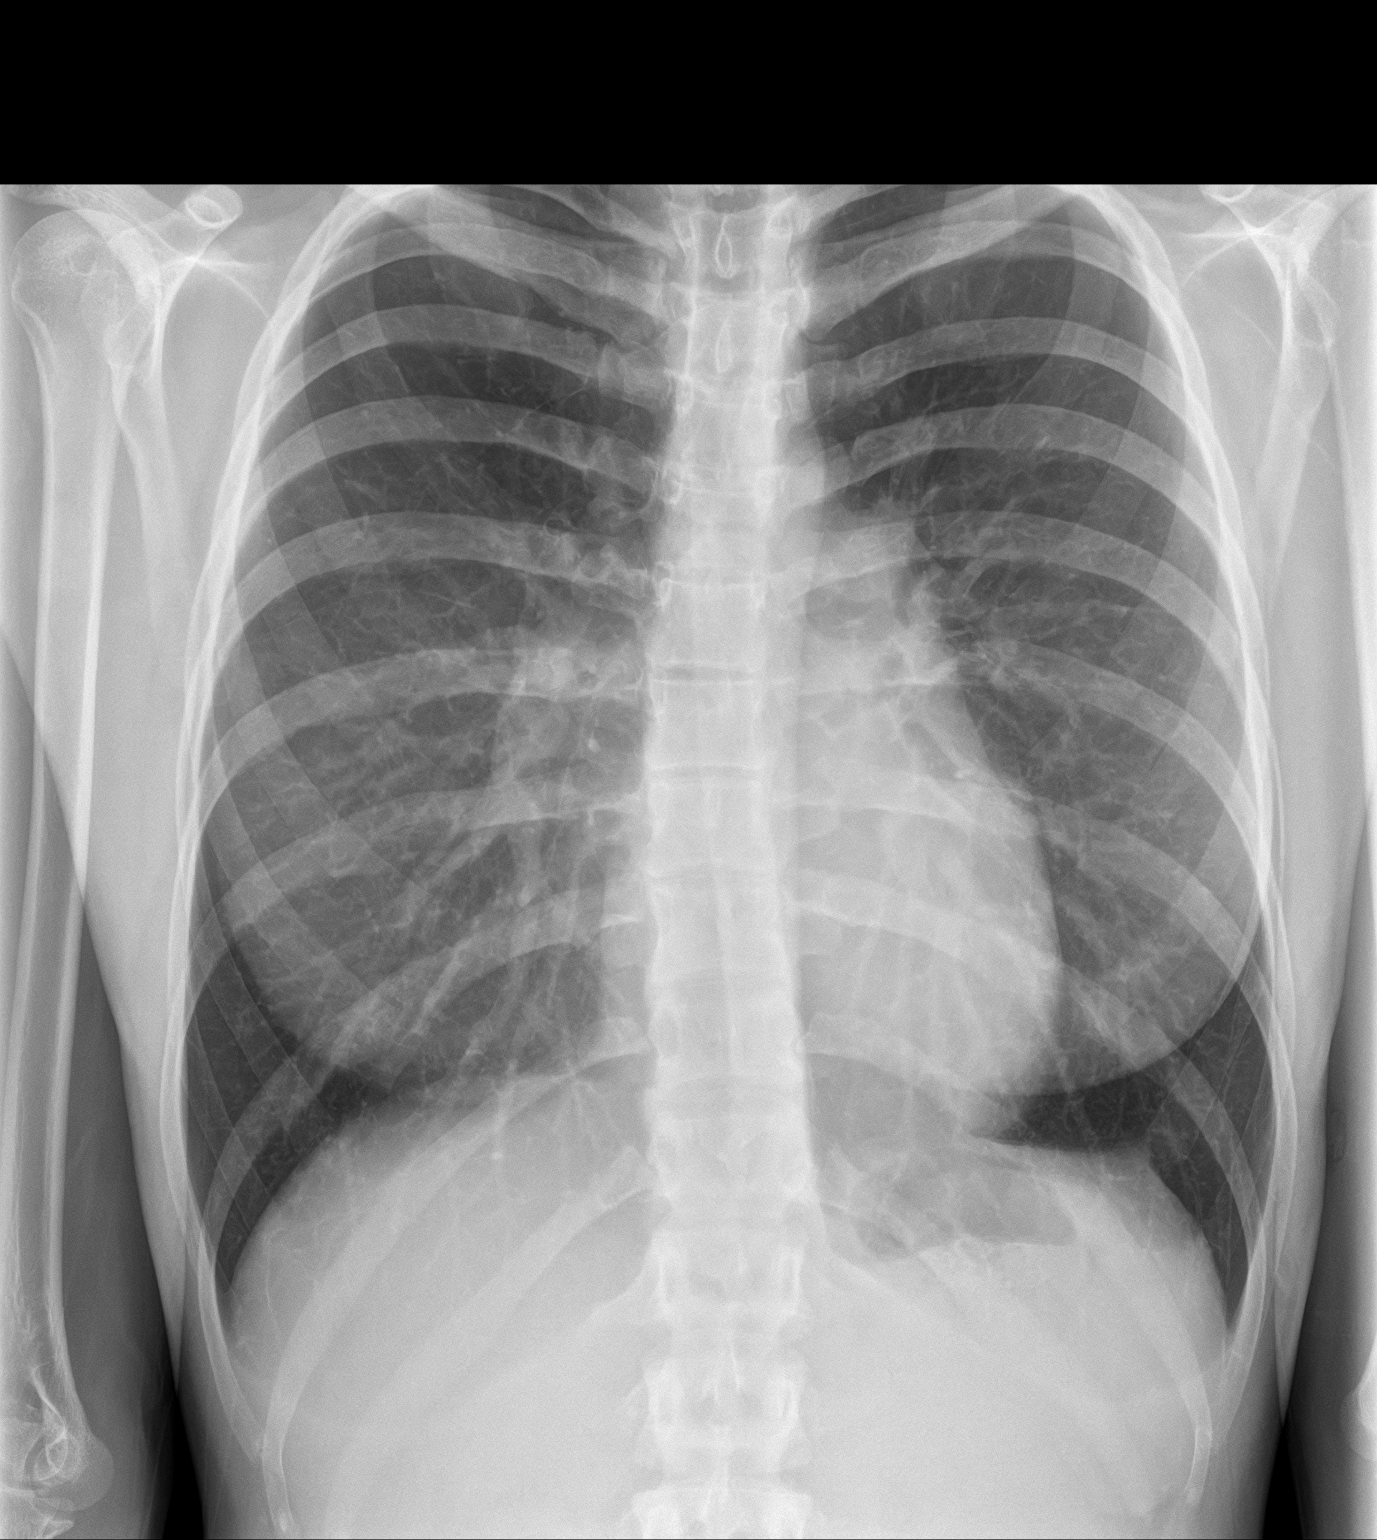
[im 2/2]
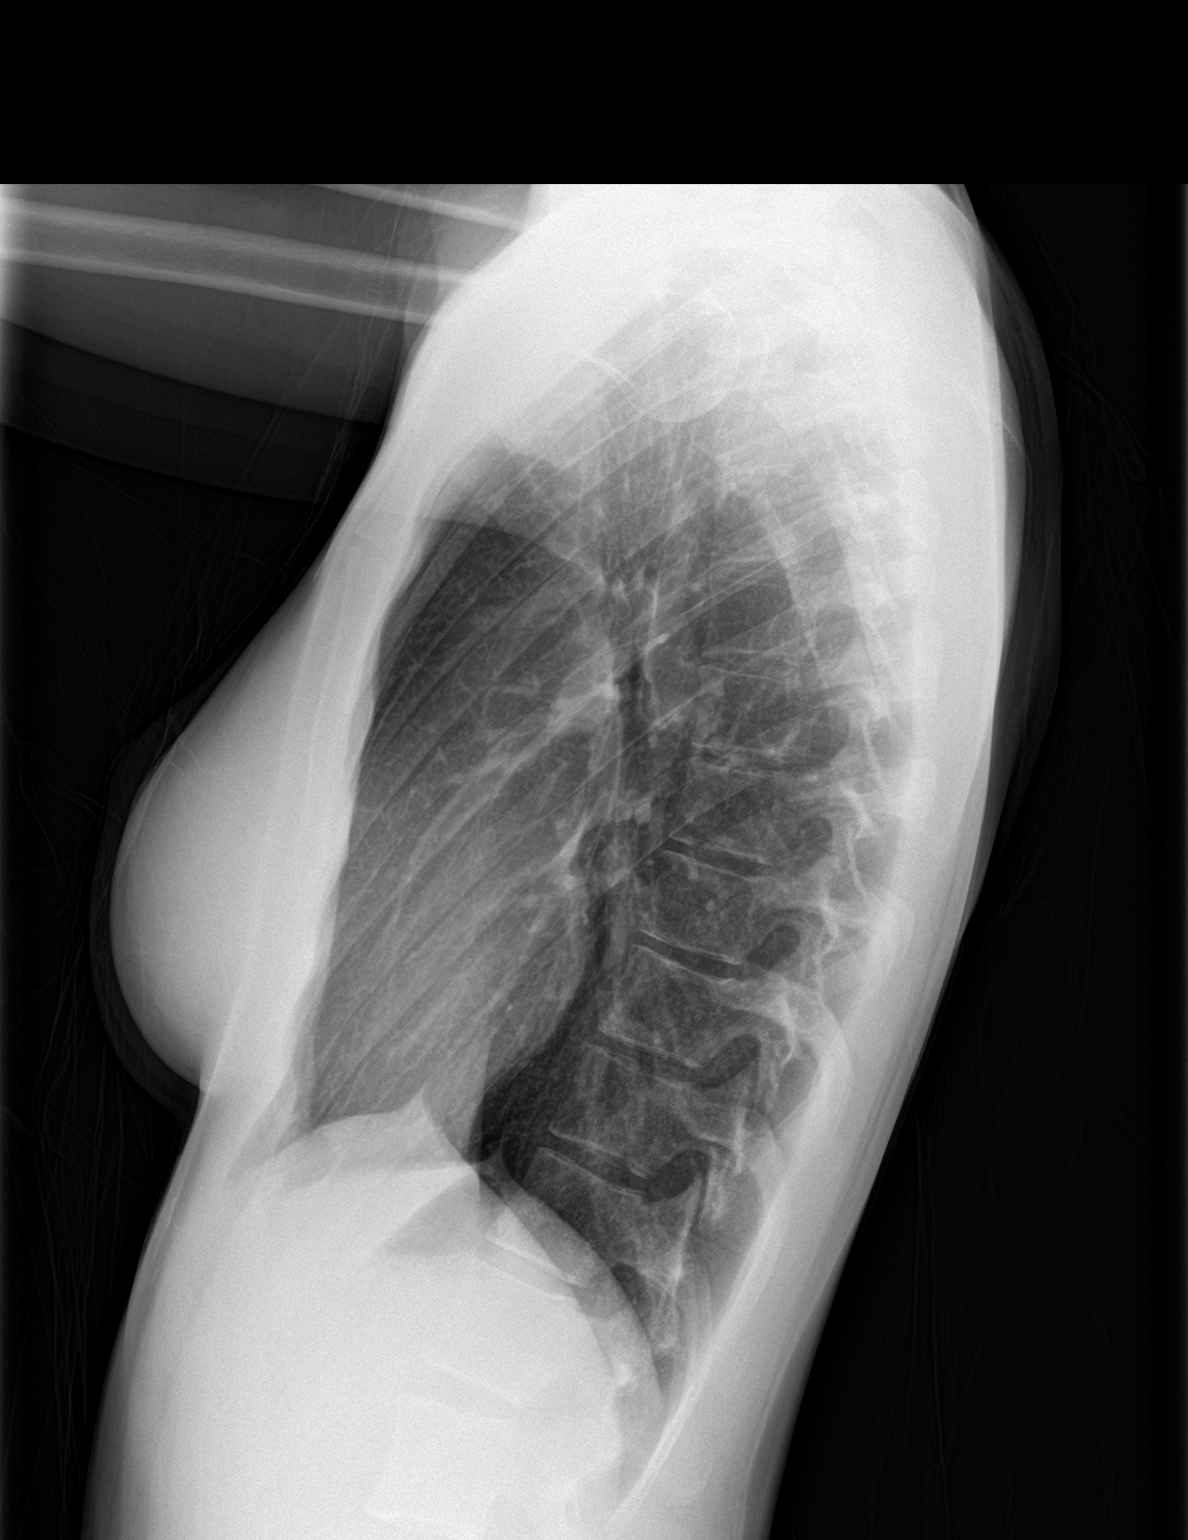

[2 of 2 positions shown; findings below may reference images not displayed]

FINDINGS: The heart size and mediastinal contours are within normal limits.
Both lungs are clear. No pneumothorax or pleural effusion is noted.
The visualized skeletal structures are unremarkable.
IMPRESSION: No active cardiopulmonary disease.

## 2019-05-15 DIAGNOSIS — H52223 Regular astigmatism, bilateral: Secondary | ICD-10-CM | POA: Diagnosis not present

## 2019-09-08 ENCOUNTER — Ambulatory Visit: Payer: 59 | Admitting: Certified Nurse Midwife

## 2019-09-08 ENCOUNTER — Other Ambulatory Visit: Payer: Self-pay

## 2019-09-08 ENCOUNTER — Encounter: Payer: Self-pay | Admitting: Certified Nurse Midwife

## 2019-09-08 VITALS — BP 100/62 | HR 68 | Temp 97.1°F | Resp 16 | Wt 114.0 lb

## 2019-09-08 DIAGNOSIS — N644 Mastodynia: Secondary | ICD-10-CM | POA: Diagnosis not present

## 2019-09-08 DIAGNOSIS — N898 Other specified noninflammatory disorders of vagina: Secondary | ICD-10-CM | POA: Diagnosis not present

## 2019-09-08 LAB — POCT URINE PREGNANCY: Preg Test, Ur: NEGATIVE

## 2019-09-08 NOTE — Progress Notes (Addendum)
   Subjective:   23 y.o. Single g0p0Caucasian female presents for evaluation of left breast  with tenderness for the past 3-4 days. She feels it is tender to touch. She has had tenderness prior, but this feels slightly different.  Denies nipple discharge or redness of breast. No recent sexual stimulation of breast. Right breast is tender, but not as much as left. Does wear underwire bra and drinks caffeine daily.She did not keep up with period this month and feels it is due. Had some spotting a day or two ago, but no period yet which is not unusual for her.. Has had  about 5 pound weight gain since last aex. Not sexually active. Patient sought evaluation because she was worried. Has missed a period before but not recently. No other concerns today.  Review of Systems Pertinent items noted in HPI and remainder of comprehensive ROS otherwise negative.   Objective:   General appearance: alert, cooperative, appears stated age and anxious Skin: warm and dry  Neck: no adenopathy and thyroid not enlarged, symmetric, no tenderness/mass/nodules Breasts: normal appearance, no masses or tenderness, No nipple retraction or dimpling, No nipple discharge or bleeding, No axillary or supraclavicular adenopathy, tender bilateral with examination, no redness bilateral. Cluster of ? breast cystic feel on outer edge of left breast, mobile, no redness, smaller area scatter from 7 o'clock to 12 o'clock, right breast cystic feel, but no distinct areas noted.  Abdomen: soft non tender   Inguinal lymph nodes non tender, no enlargement Physical Exam Exam conducted with a chaperone present.  Genitourinary:    General: Normal vulva.     Exam position: Lithotomy position.     Pubic Area: No rash.      Labia:        Right: No rash, tenderness or lesion.        Left: No rash, tenderness or lesion.      Urethra: No urethral lesion.     Vagina: Normal. No tenderness.     Cervix: No cervical motion tenderness, discharge or  lesion.     Uterus: Normal. Not tender.      Adnexa: Right adnexa normal and left adnexa normal.       Right: No mass, tenderness or fullness.         Left: No mass, tenderness or fullness.       Comments:  Scant red blood noted in vagina and from cervix. Cervix non tender. Lymphadenopathy:     Lower Body: No right inguinal adenopathy. No left inguinal adenopathy.       Assessment:   ASSESSMENT:Amenorrhea with negative pregnancy test. Period onset today. Pelvic exam normal. Contraception :not sexually active Bilateral breast tenderness menses related, suspect possible fibrocystic feel,     Plan:   PLAN: Discussed finding of normal pelvic exam and period blood noted. Also discussed premenstrual breast tenderness, especially if period with late onset. Discussed caffiene contribution to tenderness also. Discussed limiting underwire bra use due to smallness of her size, maybe increasing her discomfort. Discussed suspect fibrocystic feel and association with breast tenderness. Questions addressed at length. Patient to have follow up visit after period completion at 10 days to re-evaluate. Patient agreeable.  Rv prn

## 2019-09-08 NOTE — Patient Instructions (Addendum)
Breast Tenderness Breast tenderness is a common problem for women of all ages. Breast tenderness may cause mild discomfort to severe pain. The pain usually comes and goes in association with your menstrual cycle, but it can be constant. Breast tenderness has many possible causes, including hormone changes and some medicines. Your health care provider may order tests, such as a mammogram or an ultrasound, to check for any unusual findings. Having breast tenderness usually does not mean that you have breast cancer. Follow these instructions at home: Sometimes, reassurance that you do not have breast cancer is all that is needed. In general, follow these home care instructions: Managing pain and discomfort   If directed, apply ice to the area: ? Put ice in a plastic bag. ? Place a towel between your skin and the bag. ? Leave the ice on for 20 minutes, 2-3 times a day.  Make sure you are wearing a supportive bra, especially during exercise. You may also want to wear a supportive bra while sleeping if your breasts are very tender. Medicines  Take over-the-counter and prescription medicines only as told by your health care provider. If the cause of your pain is infection, you may be prescribed an antibiotic medicine.  If you were prescribed an antibiotic, take it as told by your health care provider. Do not stop taking the antibiotic even if you start to feel better. General instructions   Your health care provider may recommend that you reduce the amount of fat in your diet. You can do this by: ? Limiting fried foods. ? Cooking foods using methods, such as baking, boiling, grilling, and broiling.  Decrease the amount of caffeine in your diet. You can do this by drinking more water and choosing caffeine-free options.  Keep a log of the days and times when your breasts are most tender.  Ask your health care provider how to do breast exams at home. This will help you notice if you have an  unusual growth or lump. Contact a health care provider if:  Any part of your breast is hard, red, and hot to the touch. This may be a sign of infection.  You are not breastfeeding and you have fluid, especially blood or pus, coming out of your nipples.  You have a fever.  You have a new or painful lump in your breast that remains after your menstrual period ends.  Your pain does not improve or it gets worse.  Your pain is interfering with your daily activities. This information is not intended to replace advice given to you by your health care provider. Make sure you discuss any questions you have with your health care provider. Document Released: 11/02/2008 Document Revised: 11/02/2017 Document Reviewed: 08/18/2016 Elsevier Patient Education  2020 [employer].  Call if no period in 7-10 days, come in for breast recheck

## 2019-11-11 ENCOUNTER — Telehealth: Payer: Self-pay | Admitting: Certified Nurse Midwife

## 2019-11-11 DIAGNOSIS — Z01419 Encounter for gynecological examination (general) (routine) without abnormal findings: Secondary | ICD-10-CM

## 2019-11-11 NOTE — Telephone Encounter (Signed)
Pt returned call. Pt informed that we don't do Nexplanon insertions on same day as AEX. Pt will discuss Nexplanon insertion at [facility]EX on 11/14/19. Pt scheduled for Nexplanon insertion on 11/17/19 at 11am. Pt states will start next cycle on 12/11. Pt not currently sexually active and is not on birth control. Pt informed of UPT at office visit before insertion. Pt verbalized understanding.   Will route to D. Hollice Espy, CNM for review and will close encounter.   Cc: Weston Brass, orders placed for precert.

## 2019-11-11 NOTE — Telephone Encounter (Signed)
Call placed to convey benefits for Nexplanon. UNABLE to leave a message voicemail is full.

## 2019-11-11 NOTE — Telephone Encounter (Signed)
Patient is calling to request Nexplanon insertion at her annual exam on Friday, December 11. Patient stated that if she needs to have a video consult with Debbi prior to appointment, she would be willing to do that.

## 2019-11-11 NOTE — Telephone Encounter (Signed)
Left message to call back to Larine Fielding, RN  

## 2019-11-12 ENCOUNTER — Other Ambulatory Visit: Payer: Self-pay

## 2019-11-12 NOTE — Progress Notes (Signed)
23 y.o. G0P0 Single  Caucasian Fe here for annual exam. Periods normal, no issues. Patient is considering contraception, due to relationship that will probably end in marriage. She has considered Nexplanon due decrease bleeding. She has scheduled appointment for insertion. She has been experiencing more anxiety around menses and is concerned she may need medication. It resolves once period occurs. Feels hormones are changing. Would like information on other contraception. Has not been sexually active, declines STD screening.No other health issues today. Will be finishing college soon.  Patient's last menstrual period was 10/17/2019 (exact date).          Sexually active: previously sexually active, but not now The current method of family planning is abstinence.    Exercising: No.  exercise Smoker:  no  Review of Systems  Constitutional: Negative.   HENT: Negative.   Eyes: Negative.   Respiratory: Negative.   Cardiovascular: Negative.   Gastrointestinal: Negative.   Genitourinary: Negative.   Musculoskeletal: Negative.   Skin: Negative.   Neurological: Negative.   Endo/Heme/Allergies: Negative.   Psychiatric/Behavioral: Negative.     Health Maintenance: Pap:  10-23-17 neg History of Abnormal Pap: no MMG:  none Self Breast exams: yes Colonoscopy:  none BMD:   none TDaP:  2010 Shingles: no Pneumonia: no Hep C and HIV: both neg 2016 Labs: if needed   reports that she has never smoked. She has never used smokeless tobacco. She reports current alcohol use. She reports that she does not use drugs.  Past Medical History:  Diagnosis Date  . Neurocardiogenic syncope     History reviewed. No pertinent surgical history.  Current Outpatient Medications  Medication Sig Dispense Refill  . ibuprofen (ADVIL,MOTRIN) 200 MG tablet Take 600 mg by mouth every 6 (six) hours as needed for mild pain.     No current facility-administered medications for this visit.    Family History   Problem Relation Age of Onset  . Stroke Maternal Grandmother   . Cancer Maternal Grandfather   . Diabetes Maternal Grandfather   . Stroke Maternal Grandfather   . Heart attack Maternal Grandfather   . Breast cancer Paternal Grandmother 70    ROS:  Pertinent items are noted in HPI.  Otherwise, a comprehensive ROS was negative.  Exam:   BP 98/62   Pulse 64   Temp (!) 97.5 F (36.4 C) (Skin)   Resp 16   Ht 5' 5.5" (1.664 m)   Wt 110 lb (49.9 kg)   LMP 10/17/2019 (Exact Date)   BMI 18.03 kg/m  Height: 5' 5.5" (166.4 cm) Ht Readings from Last 3 Encounters:  11/14/19 5' 5.5" (1.664 m)  11/12/18 5' 4.75" (1.645 m)  08/29/18 5' 4.5" (1.638 m)    General appearance: alert, cooperative and appears stated age Head: Normocephalic, without obvious abnormality, atraumatic Neck: no adenopathy, supple, symmetrical, trachea midline and thyroid normal to inspection and palpation Lungs: clear to auscultation bilaterally Breasts: normal appearance, no masses or tenderness, No nipple retraction or dimpling, No nipple discharge or bleeding, No axillary or supraclavicular adenopathy Heart: regular rate and rhythm Abdomen: soft, non-tender; no masses,  no organomegaly Extremities: extremities normal, atraumatic, no cyanosis or edema Skin: Skin color, texture, turgor normal. No rashes or lesions Lymph nodes: Cervical, supraclavicular, and axillary nodes normal. No abnormal inguinal nodes palpated Neurologic: Grossly normal   Pelvic: External genitalia:  no lesions, normal female              Urethra:  normal appearing urethra with no masses,  tenderness or lesions              Bartholin's and Skene's: normal                 Vagina: normal appearing vagina with normal color and discharge, no lesions              Cervix: anteverted and no cervical motion tenderness              Pap taken: No. Bimanual Exam:  Uterus:  normal size, contour, position, consistency, mobility, non-tender and  anteverted              Adnexa: normal adnexa and no mass, fullness, tenderness               Rectovaginal: Confirms               Anus:  normal appearance, no lesions  Chaperone present: yes  A:  Well Woman with normal exam  Contraception desired  PMS increase and would like hormonal balance with contraception  P:   Reviewed health and wellness pertinent to exam  Discussed risks/benefits/warning signs with IUD, Nexplanon, OCP, Nuvaring. Discussed she may benefit from balanced hormonal contraception such as OCP or Nuvaring. Shown Nuvaring device. Questions answered at length. Given printed information to read. She will advise if she prefers to start on Nuvaring.  Discussed starting on Vitamin B complex 2 weeks prior to menses to help with some of the changes occurring around menses . Questions addressed.  Pap smear: no   counseled on breast self exam, STD prevention, HIV risk factors and prevention, feminine hygiene, family planning choices, adequate intake of calcium and vitamin D, diet and exercise  return annually or prn  An After Visit Summary was printed and given to the patient.

## 2019-11-14 ENCOUNTER — Ambulatory Visit: Payer: 59 | Admitting: Certified Nurse Midwife

## 2019-11-14 ENCOUNTER — Encounter: Payer: Self-pay | Admitting: Certified Nurse Midwife

## 2019-11-14 ENCOUNTER — Other Ambulatory Visit: Payer: Self-pay

## 2019-11-14 VITALS — BP 98/62 | HR 64 | Temp 97.5°F | Resp 16 | Ht 65.5 in | Wt 110.0 lb

## 2019-11-14 DIAGNOSIS — Z01419 Encounter for gynecological examination (general) (routine) without abnormal findings: Secondary | ICD-10-CM | POA: Diagnosis not present

## 2019-11-14 NOTE — Patient Instructions (Signed)
General topics  Next pap or exam is  due in 1 year Take a Women's multivitamin Take 1200 mg. of calcium daily - prefer dietary If any concerns in interim to call back  Breast Self-Awareness Practicing breast self-awareness may pick up problems early, prevent significant medical complications, and possibly save your life. By practicing breast self-awareness, you can become familiar with how your breasts look and feel and if your breasts are changing. This allows you to notice changes early. It can also offer you some reassurance that your breast health is good. One way to learn what is normal for your breasts and whether your breasts are changing is to do a breast self-exam. If you find a lump or something that was not present in the past, it is best to contact your caregiver right away. Other findings that should be evaluated by your caregiver include nipple discharge, especially if it is bloody; skin changes or reddening; areas where the skin seems to be pulled in (retracted); or new lumps and bumps. Breast pain is seldom associated with cancer (malignancy), but should also be evaluated by a caregiver. BREAST SELF-EXAM The best time to examine your breasts is 5 7 days after your menstrual period is over.  ExitCare Patient Information 2013 ExitCare[employer].   Exercise to Stay Healthy Exercise helps you become and stay healthy. EXERCISE IDEAS AND TIPS Choose exercises that:  You enjoy.  Fit into your day. You do not need to exercise really hard to be healthy. You can do exercises at a slow or medium level and stay healthy. You can:  Stretch before and after working out.  Try yoga, Pilates, or tai chi.  Lift weights.  Walk fast, swim, jog, run, climb stairs, bicycle, dance, or rollerskate.  Take aerobic classes. Exercises that burn about 150 calories:  Running 1  miles in 15 minutes.  Playing volleyball for 45 to 60 minutes.  Washing and waxing a car for 45 to 60  minutes.  Playing touch football for 45 minutes.  Walking 1  miles in 35 minutes.  Pushing a stroller 1  miles in 30 minutes.  Playing basketball for 30 minutes.  Raking leaves for 30 minutes.  Bicycling 5 miles in 30 minutes.  Walking 2 miles in 30 minutes.  Dancing for 30 minutes.  Shoveling snow for 15 minutes.  Swimming laps for 20 minutes.  Walking up stairs for 15 minutes.  Bicycling 4 miles in 15 minutes.  Gardening for 30 to 45 minutes.  Jumping rope for 15 minutes.  Washing windows or floors for 45 to 60 minutes. Document Released: 12/23/2010 Document Revised: 02/12/2012 Document Reviewed: 12/23/2010 ExitCare Patient Information 2013 ExitCare[employer].   Other topics ( that may be useful information):    Sexually Transmitted Disease Sexually transmitted disease (STD) refers to any infection that is passed from person to person during sexual activity. This may happen by way of saliva, semen, blood, vaginal mucus, or urine. Common STDs include:  Gonorrhea.  Chlamydia.  Syphilis.  HIV/AIDS.  Genital herpes.  Hepatitis B and C.  Trichomonas.  Human papillomavirus (HPV).  Pubic lice. CAUSES  An STD may be spread by bacteria, virus, or parasite. A person can get an STD by:  Sexual intercourse with an infected person.  Sharing sex toys with an infected person.  Sharing needles with an infected person.  Having intimate contact with the genitals, mouth, or rectal areas of an infected person. SYMPTOMS  Some people may not have any symptoms, but   they can still pass the infection to others. Different STDs have different symptoms. Symptoms include:  Painful or bloody urination.  Pain in the pelvis, abdomen, vagina, anus, throat, or eyes.  Skin rash, itching, irritation, growths, or sores (lesions). These usually occur in the genital or anal area.  Abnormal vaginal discharge.  Penile discharge in men.  Soft, flesh-colored skin growths in the  genital or anal area.  Fever.  Pain or bleeding during sexual intercourse.  Swollen glands in the groin area.  Yellow skin and eyes (jaundice). This is seen with hepatitis. DIAGNOSIS  To make a diagnosis, your caregiver may:  Take a medical history.  Perform a physical exam.  Take a specimen (culture) to be examined.  Examine a sample of discharge under a microscope.  Perform blood test TREATMENT   Chlamydia, gonorrhea, trichomonas, and syphilis can be cured with antibiotic medicine.  Genital herpes, hepatitis, and HIV can be treated, but not cured, with prescribed medicines. The medicines will lessen the symptoms.  Genital warts from HPV can be treated with medicine or by freezing, burning (electrocautery), or surgery. Warts may come back.  HPV is a virus and cannot be cured with medicine or surgery.However, abnormal areas may be followed very closely by your caregiver and may be removed from the cervix, vagina, or vulva through office procedures or surgery. If your diagnosis is confirmed, your recent sexual partners need treatment. This is true even if they are symptom-free or have a negative culture or evaluation. They should not have sex until their caregiver says it is okay. HOME CARE INSTRUCTIONS  All sexual partners should be informed, tested, and treated for all STDs.  Take your antibiotics as directed. Finish them even if you start to feel better.  Only take over-the-counter or prescription medicines for pain, discomfort, or fever as directed by your caregiver.  Rest.  Eat a balanced diet and drink enough fluids to keep your urine clear or pale yellow.  Do not have sex until treatment is completed and you have followed up with your caregiver. STDs should be checked after treatment.  Keep all follow-up appointments, Pap tests, and blood tests as directed by your caregiver.  Only use latex condoms and water-soluble lubricants during sexual activity. Do not use  petroleum jelly or oils.  Avoid alcohol and illegal drugs.  Get vaccinated for HPV and hepatitis. If you have not received these vaccines in the past, talk to your caregiver about whether one or both might be right for you.  Avoid risky sex practices that can break the skin. The only way to avoid getting an STD is to avoid all sexual activity.Latex condoms and dental dams (for oral sex) will help lessen the risk of getting an STD, but will not completely eliminate the risk. SEEK MEDICAL CARE IF:   You have a fever.  You have any new or worsening symptoms. Document Released: 02/10/2003 Document Revised: 02/12/2012 Document Reviewed: 02/17/2011 [facility] Patient Information 2013 [place].    Domestic Abuse You are being battered or abused if someone close to you hits, pushes, or physically hurts you in any way. You also are being abused if you are forced into activities. You are being sexually abused if you are forced to have sexual contact of any kind. You are being emotionally abused if you are made to feel worthless or if you are constantly threatened. It is important to remember that help is available. No one has the right to abuse you. PREVENTION OF FURTHER  ABUSE  Learn the warning signs of danger. This varies with situations but may include: the use of alcohol, threats, isolation from friends and family, or forced sexual contact. Leave if you feel that violence is going to occur.  If you are attacked or beaten, report it to the police so the abuse is documented. You do not have to press charges. The police can protect you while you or the attackers are leaving. Get the officer's name and badge number and a copy of the report.  Find someone you can trust and tell them what is happening to you: your caregiver, a nurse, clergy member, close friend or family member. Feeling ashamed is natural, but remember that you have done nothing wrong. No one deserves abuse. Document Released:  11/17/2000 Document Revised: 02/12/2012 Document Reviewed: 01/26/2011 ExitCare Patient Information 2013 ExitCare[employer] LLC.    How Much is Too Much Alcohol? Drinking too much alcohol can cause injury, accidents, and health problems. These types of problems can include:   Car crashes.  Falls.  Family fighting (domestic violence).  Drowning.  Fights.  Injuries.  Burns.  Damage to certain organs.  Having a baby with birth defects. ONE DRINK CAN BE TOO MUCH WHEN YOU ARE:  Working.  Pregnant or breastfeeding.  Taking medicines. Ask your doctor.  Driving or planning to drive. If you or someone you know has a drinking problem, get help from a doctor.  Document Released: 09/16/2009 Document Revised: 02/12/2012 Document Reviewed: 09/16/2009 ExitCare Patient Information 2013 ExitCare[employer].   Smoking Hazards Smoking cigarettes is extremely bad for your health. Tobacco smoke has over 200 known poisons in it. There are over 60 chemicals in tobacco smoke that cause cancer. Some of the chemicals found in cigarette smoke include:   Cyanide.  Benzene.  Formaldehyde.  Methanol (wood alcohol).  Acetylene (fuel used in welding torches).  Ammonia. Cigarette smoke also contains the poisonous gases nitrogen oxide and carbon monoxide.  Cigarette smokers have an increased risk of many serious medical problems and Smoking causes approximately:  90% of all lung cancer deaths in men.  80% of all lung cancer deaths in women.  90% of deaths from chronic obstructive lung disease. Compared with nonsmokers, smoking increases the risk of:  Coronary heart disease by 2 to 4 times.  Stroke by 2 to 4 times.  Men developing lung cancer by 23 times.  Women developing lung cancer by 13 times.  Dying from chronic obstructive lung diseases by 12 times.  . Smoking is the most preventable cause of death and disease in our society.  WHY IS SMOKING ADDICTIVE?  Nicotine is the chemical  agent in tobacco that is capable of causing addiction or dependence.  When you smoke and inhale, nicotine is absorbed rapidly into the bloodstream through your lungs. Nicotine absorbed through the lungs is capable of creating a powerful addiction. Both inhaled and non-inhaled nicotine may be addictive.  Addiction studies of cigarettes and spit tobacco show that addiction to nicotine occurs mainly during the teen years, when young people begin using tobacco products. WHAT ARE THE BENEFITS OF QUITTING?  There are many health benefits to quitting smoking.   Likelihood of developing cancer and heart disease decreases. Health improvements are seen almost immediately.  Blood pressure, pulse rate, and breathing patterns start returning to normal soon after quitting. QUITTING SMOKING   American Lung Association - 1-800-LUNGUSA  American Cancer Society - 1-800-ACS-2345 Document Released: 12/28/2004 Document Revised: 02/12/2012 Document Reviewed: 09/01/2009 ExitCare Patient Information 2013 ExitCare,   LLC.   Stress Management Stress is a state of physical or mental tension that often results from changes in your life or normal routine. Some common causes of stress are:  Death of a loved one.  Injuries or severe illnesses.  Getting fired or changing jobs.  Moving into a new home. Other causes may be:  Sexual problems.  Business or financial losses.  Taking on a large debt.  Regular conflict with someone at home or at work.  Constant tiredness from lack of sleep. It is not just bad things that are stressful. It may be stressful to:  Win the lottery.  Get married.  Buy a new car. The amount of stress that can be easily tolerated varies from person to person. Changes generally cause stress, regardless of the types of change. Too much stress can affect your health. It may lead to physical or emotional problems. Too little stress (boredom) may also become stressful. SUGGESTIONS TO  REDUCE STRESS:  Talk things over with your family and friends. It often is helpful to share your concerns and worries. If you feel your problem is serious, you may want to get help from a professional counselor.  Consider your problems one at a time instead of lumping them all together. Trying to take care of everything at once may seem impossible. List all the things you need to do and then start with the most important one. Set a goal to accomplish 2 or 3 things each day. If you expect to do too many in a single day you will naturally fail, causing you to feel even more stressed.  Do not use alcohol or drugs to relieve stress. Although you may feel better for a short time, they do not remove the problems that caused the stress. They can also be habit forming.  Exercise regularly - at least 3 times per week. Physical exercise can help to relieve that "uptight" feeling and will relax you.  The shortest distance between despair and hope is often a good night's sleep.  Go to bed and get up on time allowing yourself time for appointments without being rushed.  Take a short "time-out" period from any stressful situation that occurs during the day. Close your eyes and take some deep breaths. Starting with the muscles in your face, tense them, hold it for a few seconds, then relax. Repeat this with the muscles in your neck, shoulders, hand, stomach, back and legs.  Take good care of yourself. Eat a balanced diet and get plenty of rest.  Schedule time for having fun. Take a break from your daily routine to relax. HOME CARE INSTRUCTIONS   Call if you feel overwhelmed by your problems and feel you can no longer manage them on your own.  Return immediately if you feel like hurting yourself or someone else. Document Released: 05/16/2001 Document Revised: 02/12/2012 Document Reviewed: 01/06/2008 [facility] Patient Information 2013 [place].   Premenstrual Syndrome Premenstrual syndrome (PMS) is  a group of physical, emotional, and behavioral symptoms that affect women of childbearing age as part of their menstrual cycle. PMS starts 1-2 weeks before the start of a woman's menstrual period and goes away a few days after menstrual bleeding starts. It often happens in a predictable pattern (recurs). PMS may cause other health conditions to become worse, such as asthma, allergies, and migraines. PMS can range from mild to severe. When it is severe, it is called premenstrual dysphoric disorder (PMDD). PMS may interfere with normal daily activities.  What are the causes? The cause of this condition is not known, but it seems to be related to hormone changes that happen before menstruation. What are the signs or symptoms? Symptoms of this condition often happen every month. They go away completely after your period starts. Physical symptoms of this condition include:  Bloating.  Breast pain.  Headaches.  Extreme fatigue.  Backaches.  Swelling of the hands and feet.  Weight gain.  Hot flashes. Emotional and behavioral symptoms of this condition include:  Mood swings.  Depression.  Angry outbursts.  Irritability.  Anxiety.  Crying spells.  Food cravings or appetite changes.  Changes in sexual desire.  Confusion.  Aggression.  Social withdrawal.  Poor concentration. How is this diagnosed? This condition may be diagnosed based on a history of your symptoms. This condition is generally diagnosed if symptoms of PMS:  Are present in the 5 days before your period starts.  End within 4 days after your period starts.  Happen at least 3 months in a row.  Interfere with some of your normal activities. Other conditions that can cause some of these symptoms must be ruled out before PMS can be diagnosed. These include depression, anxiety, anemia, and thyroid problems. How is this treated? This condition may be treated by:  Maintaining a healthy lifestyle. This includes  eating a well-balanced diet and exercising regularly.  Taking medicines. Medicines can help relieve symptoms such as cramps, aches, pains, headaches, and breast tenderness. Depending on the severity of the condition, your health care provider may recommend various over-the-counter pain medicines. Follow these instructions at home: Eating and drinking   Eat a well-balanced diet.  Avoid caffeine and alcohol.  Limit the amount of salt and salty foods you eat. This will help reduce bloating.  Drink enough fluid to keep your urine pale yellow.  Take a multivitamin if told to do so by your health care provider. Lifestyle   Do not use any products that contain nicotine or tobacco, such as cigarettes, e-cigarettes, and chewing tobacco. If you need help quitting, ask your health care provider.  Exercise regularly as suggested by your health care provider.  Get enough sleep. For most adults, this is 7-8 hours of sleep each night.  Practice relaxation techniques such as yoga, tai chi, or meditation.  Find healthy ways to manage stress. General instructions   For 2-3 months, write down your symptoms, their severity, and how long they last. This will help your health care provider choose the best treatment for you.  Take over-the-counter and prescription medicines only as told by your health care provider.  If you are using birth control pills (oral contraceptives), use them as told by your health care provider. Contact a health care provider if:  Your symptoms get worse.  You develop new symptoms.  You have trouble doing your daily activities. Summary  Premenstrual syndrome (PMS) is a group of physical, emotional, and behavioral symptoms that affect women of childbearing age.  PMS starts 1-2 weeks before the start of a woman's period and goes away a few days after the period starts.  PMS is treated by maintaining a healthy lifestyle and taking medicines to relieve the  symptoms. This information is not intended to replace advice given to you by your health care provider. Make sure you discuss any questions you have with your health care provider. Document Released: 11/17/2000 Document Revised: 07/03/2018 Document Reviewed: 07/03/2018 Elsevier Patient Education  2020 [employer].

## 2019-11-17 ENCOUNTER — Telehealth: Payer: Self-pay | Admitting: Certified Nurse Midwife

## 2019-11-17 ENCOUNTER — Ambulatory Visit: Payer: Self-pay | Admitting: Certified Nurse Midwife

## 2019-11-17 NOTE — Telephone Encounter (Signed)
Patient cancelled appointment for nexplanon today. She was in on Friday and appointment was not needed.

## 2020-02-16 ENCOUNTER — Encounter: Payer: Self-pay | Admitting: Certified Nurse Midwife

## 2020-02-18 ENCOUNTER — Encounter: Payer: Self-pay | Admitting: Certified Nurse Midwife

## 2020-10-20 ENCOUNTER — Encounter: Payer: Self-pay | Admitting: Obstetrics and Gynecology

## 2020-10-20 ENCOUNTER — Other Ambulatory Visit: Payer: Self-pay | Admitting: Obstetrics and Gynecology

## 2020-10-20 ENCOUNTER — Telehealth: Payer: Self-pay | Admitting: *Deleted

## 2020-10-20 ENCOUNTER — Ambulatory Visit (INDEPENDENT_AMBULATORY_CARE_PROVIDER_SITE_OTHER): Payer: 59 | Admitting: Obstetrics and Gynecology

## 2020-10-20 ENCOUNTER — Other Ambulatory Visit: Payer: Self-pay

## 2020-10-20 VITALS — BP 100/70 | HR 76 | Resp 16 | Ht 65.5 in | Wt 118.0 lb

## 2020-10-20 DIAGNOSIS — R1031 Right lower quadrant pain: Secondary | ICD-10-CM

## 2020-10-20 DIAGNOSIS — Z113 Encounter for screening for infections with a predominantly sexual mode of transmission: Secondary | ICD-10-CM

## 2020-10-20 DIAGNOSIS — N949 Unspecified condition associated with female genital organs and menstrual cycle: Secondary | ICD-10-CM | POA: Diagnosis not present

## 2020-10-20 DIAGNOSIS — B9689 Other specified bacterial agents as the cause of diseases classified elsewhere: Secondary | ICD-10-CM

## 2020-10-20 DIAGNOSIS — R102 Pelvic and perineal pain: Secondary | ICD-10-CM | POA: Diagnosis not present

## 2020-10-20 DIAGNOSIS — N760 Acute vaginitis: Secondary | ICD-10-CM | POA: Diagnosis not present

## 2020-10-20 DIAGNOSIS — R350 Frequency of micturition: Secondary | ICD-10-CM

## 2020-10-20 LAB — POCT URINE PREGNANCY: Preg Test, Ur: NEGATIVE

## 2020-10-20 LAB — POCT URINALYSIS DIPSTICK
Bilirubin, UA: NEGATIVE
Blood, UA: NEGATIVE
Glucose, UA: NEGATIVE
Ketones, UA: NEGATIVE
Nitrite, UA: NEGATIVE
Protein, UA: NEGATIVE
Urobilinogen, UA: 0.2 E.U./dL
pH, UA: 5 (ref 5.0–8.0)

## 2020-10-20 MED ORDER — METRONIDAZOLE 500 MG PO TABS: 500.0000 mg | ORAL_TABLET | Freq: Two times a day (BID) | ORAL | 0 refills | Status: DC

## 2020-10-20 MED ORDER — IBUPROFEN 800 MG PO TABS: 800.0000 mg | ORAL_TABLET | Freq: Three times a day (TID) | ORAL | 1 refills | Status: DC | PRN

## 2020-10-20 NOTE — Progress Notes (Signed)
GYNECOLOGY  VISIT   HPI: 24 y.o.   Single White or Caucasian Not Hispanic or Latino  female   G0P0 with Patient's last menstrual period was 10/02/2020.   here for RLQ pain, vaginal odor, and discharge. "Sharp, stabbing pain" in RLQ along with nausea and dizziness. Per patient, also has vaginal odor and yellow discharge for about 2 weeks with a "vinegar odor".  One month h/o frequent urination, and urgency to urinate, no dysuria. She does drink a lot of caffeine.   2 week h/o a yellow vaginal d/c with an odor. No itching, burning or irritation.   The pain started last night, in RLQ, fairly consistent. Last night it was severe, this am also severe. Currently the baseline pain is mild, much worse if she pushes in that area, hurts to wear a seat belt. No fevers, no bowel or bladder c/o. She has had nausea.   Cycles are monthly and normal. LMP 09/22/20.  Not currently sexually active, was 1.5 months ago (one time, no condoms).     GYNECOLOGIC HISTORY: Patient's last menstrual period was 10/02/2020. Contraception:none -- last SA about a month ago per patient Menopausal hormone therapy: n/a        OB History    Gravida  0   Para      Term      Preterm      AB      Living        SAB      TAB      Ectopic      Multiple      Live Births                 Patient Active Problem List   Diagnosis Date Noted   Menorrhagia with regular cycle 07/28/2016    Class: Diagnosis of    Past Medical History:  Diagnosis Date   Neurocardiogenic syncope     History reviewed. No pertinent surgical history.  Current Outpatient Medications  Medication Sig Dispense Refill   ibuprofen (ADVIL,MOTRIN) 200 MG tablet Take 600 mg by mouth every 6 (six) hours as needed for mild pain.     No current facility-administered medications for this visit.     ALLERGIES: Avocado, Other, Shellfish allergy, Amoxicillin, Cinnamon, Penicillins, and Sulfa antibiotics  Family History  Problem  Relation Age of Onset   Stroke Maternal Grandmother    Cancer Maternal Grandfather    Diabetes Maternal Grandfather    Stroke Maternal Grandfather    Heart attack Maternal Grandfather    Breast cancer Paternal Grandmother 19    Social History   Socioeconomic History   Marital status: Single    Spouse name: Not on file   Number of children: Not on file   Years of education: Not on file   Highest education level: Not on file  Occupational History   Not on file  Tobacco Use   Smoking status: Never Smoker   Smokeless tobacco: Never Used  Vaping Use   Vaping Use: Never used  Substance and Sexual Activity   Alcohol use: Yes    Comment: 2 a month   Drug use: No   Sexual activity: Not Currently    Partners: Male    Birth control/protection: None  Other Topics Concern   Not on file  Social History Narrative   Not on file   Social Determinants of Health   Financial Resource Strain:    Difficulty of Paying Living Expenses: Not on file  Food Insecurity:    Worried About Programme researcher, broadcasting/film/video in the Last Year: Not on file   [employer] of Food in the Last Year: Not on file  Transportation Needs:    Lack of Transportation (Medical): Not on file   Lack of Transportation (Non-Medical): Not on file  Physical Activity:    Days of Exercise per Week: Not on file   Minutes of Exercise per Session: Not on file  Stress:    Feeling of Stress : Not on file  Social Connections:    Frequency of Communication with Friends and Family: Not on file   Frequency of Social Gatherings with Friends and Family: Not on file   Attends Religious Services: Not on file   Active Member of Clubs or Organizations: Not on file   Attends Banker Meetings: Not on file   Marital Status: Not on file  Intimate Partner Violence:    Fear of Current or Ex-Partner: Not on file   Emotionally Abused: Not on file   Physically Abused: Not on file   Sexually Abused: Not  on file    Review of Systems  Constitutional: Negative.   HENT: Negative.   Eyes: Negative.   Respiratory: Negative.   Cardiovascular: Negative.   Gastrointestinal: Positive for nausea.       RLQ pain  Genitourinary: Positive for frequency and urgency.       Discharge Odor   Musculoskeletal: Negative.   Skin: Negative.   Neurological: Negative.   Endo/Heme/Allergies: Negative.   Psychiatric/Behavioral: Negative.     PHYSICAL EXAMINATION:    BP 100/70 (BP Location: Right Arm, Patient Position: Sitting, Cuff Size: Normal)    Pulse 76    Resp 16    Ht 5' 5.5" (1.664 m)    Wt 118 lb (53.5 kg)    LMP 10/02/2020    BMI 19.34 kg/m     General appearance: alert, cooperative and appears stated age Abdomen: soft, tender low in the RLQ, no rebound, no guarding, not distended.   Pelvic: External genitalia:  no lesions              Urethra:  normal appearing urethra with no masses, tenderness or lesions              Bartholins and Skenes: normal                 Vagina: normal appearing vagina with a slight increase in watery/yellow vaginal d/c              Cervix: no cervical motion tenderness, no lesions and friable with collection of cervical cultures              Bimanual Exam:  Uterus:  normal size, contour, position, consistency, mobility, non-tender              Adnexa: tender, cystic feeling mass in the right adnexa, ~5 cm               Chaperone was present for exam.  Wet prep: + clue, no trich, few wbc KOH: no yeast PH: 5  ASSESSMENT RLQ abdominal/pelvic pain. Exam suspicious for ovarian cyst BV Urinary frequency    PLAN UPT: Negative Urine: trace WBC Send urine for ua, c&s Return for pelvic ultrasound Ibuprofen for pain If pain gets acutely worse she needs to be seen If pain resolves she will need an exam prior to canceling ultrasound Treat BV

## 2020-10-20 NOTE — Telephone Encounter (Signed)
Patient requesting OV for evaluation of RLQ pain that started on 10/19/20. Advised patient triage nurse will return call to further assess.   Last AEX 11/14/19 w/ Leota Sauers, CNM

## 2020-10-20 NOTE — Telephone Encounter (Signed)
Call to patient. Patient states she starting experiencing RLQ last night. Took 800mg  of ibuprofen and was able to sleep through the night. Woke up this morning and pain is still there. Describes as a tight, stabbing pain. Worse when touched. LMP was 10-02-20, lasted 6-7 days with moderate bleeding. Has not been sexually active in 1.5 months and not currently on any type of contraception. Denies fever, chills or urinary symptoms. Slight nausea, but has not vomited. LBM was yesterday morning. States she has noticed an increase in yellow vaginal discharge that has a "vinegar" odor to it. RN advised OV recommended for further evaluation. OV scheduled for 10-20-20 at 1315. Patient agreeable to date and time of appointment. Denies Covid symptoms or recent covid exposure.   Routing to provider and will close encounter.

## 2020-10-20 NOTE — Patient Instructions (Signed)
Ovarian Cyst     An ovarian cyst is a fluid-filled sac that forms on an ovary. The ovaries are small organs that produce eggs in women. Various types of cysts can form on the ovaries. Some may cause symptoms and require treatment. Most ovarian cysts go away on their own, are not cancerous (are benign), and do not cause problems. Common types of ovarian cysts include:  Functional (follicle) cysts. ? Occur during the menstrual cycle, and usually go away with the next menstrual cycle if you do not get pregnant. ? Usually cause no symptoms.  Endometriomas. ? Are cysts that form from the tissue that lines the uterus (endometrium). ? Are sometimes called "chocolate cysts" because they become filled with blood that turns brown. ? Can cause pain in the lower abdomen during intercourse and during your period.  Cystadenoma cysts. ? Develop from cells on the outside surface of the ovary. ? Can get very large and cause lower abdomen pain and pain with intercourse. ? Can cause severe pain if they twist or break open (rupture).  Dermoid cysts. ? Are sometimes found in both ovaries. ? May contain different kinds of body tissue, such as skin, teeth, hair, or cartilage. ? Usually do not cause symptoms unless they get very big.  Theca lutein cysts. ? Occur when too much of a certain hormone (human chorionic gonadotropin) is produced and overstimulates the ovaries to produce an egg. ? Are most common after having procedures used to assist with the conception of a baby (in vitro fertilization). What are the causes? Ovarian cysts may be caused by:  Ovarian hyperstimulation syndrome. This is a condition that can develop from taking fertility medicines. It causes multiple large ovarian cysts to form.  Polycystic ovarian syndrome (PCOS). This is a common hormonal disorder that can cause ovarian cysts, as well as problems with your period or fertility. What increases the risk? The following factors may  make you more likely to develop ovarian cysts:  Being overweight or obese.  Taking fertility medicines.  Taking certain forms of hormonal birth control.  Smoking. What are the signs or symptoms? Many ovarian cysts do not cause symptoms. If symptoms are present, they may include:  Pelvic pain or pressure.  Pain in the lower abdomen.  Pain during sex.  Abdominal swelling.  Abnormal menstrual periods.  Increasing pain with menstrual periods. How is this diagnosed? These cysts are commonly found during a routine pelvic exam. You may have tests to find out more about the cyst, such as:  Ultrasound.  X-ray of the pelvis.  CT scan.  MRI.  Blood tests. How is this treated? Many ovarian cysts go away on their own without treatment. Your health care provider may want to check your cyst regularly for 2-3 months to see if it changes. If you are in menopause, it is especially important to have your cyst monitored closely because menopausal women have a higher rate of ovarian cancer. When treatment is needed, it may include:  Medicines to help relieve pain.  A procedure to drain the cyst (aspiration).  Surgery to remove the whole cyst.  Hormone treatment or birth control pills. These methods are sometimes used to help dissolve a cyst. Follow these instructions at home:  Take over-the-counter and prescription medicines only as told by your health care provider.  Do not drive or use heavy machinery while taking prescription pain medicine.  Get regular pelvic exams and Pap tests as often as told by your health care provider.    Return to your normal activities as told by your health care provider. Ask your health care provider what activities are safe for you.  Do not use any products that contain nicotine or tobacco, such as cigarettes and e-cigarettes. If you need help quitting, ask your health care provider.  Keep all follow-up visits as told by your health care provider.  This is important. Contact a health care provider if:  Your periods are late, irregular, or painful, or they stop.  You have pelvic pain that does not go away.  You have pressure on your bladder or trouble emptying your bladder completely.  You have pain during sex.  You have any of the following in your abdomen: ? A feeling of fullness. ? Pressure. ? Discomfort. ? Pain that does not go away. ? Swelling.  You feel generally ill.  You become constipated.  You lose your appetite.  You develop severe acne.  You start to have more body hair and facial hair.  You are gaining weight or losing weight without changing your exercise and eating habits.  You think you may be pregnant. Get help right away if:  You have abdominal pain that is severe or gets worse.  You cannot eat or drink without vomiting.  You suddenly develop a fever.  Your menstrual period is much heavier than usual. This information is not intended to replace advice given to you by your health care provider. Make sure you discuss any questions you have with your health care provider. Document Revised: 02/18/2018 Document Reviewed: 04/23/2016 Elsevier Patient Education  2020 [employer]. Vaginitis Vaginitis is a condition in which the vaginal tissue swells and becomes red (inflamed). This condition is most often caused by a change in the normal balance of bacteria and yeast that live in the vagina. This change causes an overgrowth of certain bacteria or yeast, which causes the inflammation. There are different types of vaginitis, but the most common types are:  Bacterial vaginosis.  Yeast infection (candidiasis).  Trichomoniasis vaginitis. This is a sexually transmitted disease (STD).  Viral vaginitis.  Atrophic vaginitis.  Allergic vaginitis. What are the causes? The cause of this condition depends on the type of vaginitis. It can be caused by:  Bacteria (bacterial vaginosis).  Yeast, which is a  fungus (yeast infection).  A parasite (trichomoniasis vaginitis).  A virus (viral vaginitis).  Low hormone levels (atrophic vaginitis). Low hormone levels can occur during pregnancy, breastfeeding, or after menopause.  Irritants, such as bubble baths, scented tampons, and feminine sprays (allergic vaginitis). Other factors can change the normal balance of the yeast and bacteria that live in the vagina. These include:  Antibiotic medicines.  Poor hygiene.  Diaphragms, vaginal sponges, spermicides, birth control pills, and intrauterine devices (IUD).  Sex.  Infection.  Uncontrolled diabetes.  A weakened defense (immune) system. What increases the risk? This condition is more likely to develop in women who:  Smoke.  Use vaginal douches, scented tampons, or scented sanitary pads.  Wear tight-fitting pants.  Wear thong underwear.  Use oral birth control pills or an IUD.  Have sex without a condom.  Have multiple sex partners.  Have an STD.  Frequently use the spermicide nonoxynol-9.  Eat lots of foods high in sugar.  Have uncontrolled diabetes.  Have low estrogen levels.  Have a weakened immune system from an immune disorder or medical treatment.  Are pregnant or breastfeeding. What are the signs or symptoms? Symptoms vary depending on the cause of the vaginitis. Common symptoms  include:  Abnormal vaginal discharge. ? The discharge is white, gray, or yellow with bacterial vaginosis. ? The discharge is thick, white, and cheesy with a yeast infection. ? The discharge is frothy and yellow or greenish with trichomoniasis.  A bad vaginal smell. The smell is fishy with bacterial vaginosis.  Vaginal itching, pain, or swelling.  Sex that is painful.  Pain or burning when urinating. Sometimes there are no symptoms. How is this diagnosed? This condition is diagnosed based on your symptoms and medical history. A physical exam, including a pelvic exam, will also  be done. You may also have other tests, including:  Tests to determine the pH level (acidity or alkalinity) of your vagina.  A whiff test, to assess the odor that results when a sample of your vaginal discharge is mixed with a potassium hydroxide solution.  Tests of vaginal fluid. A sample will be examined under a microscope. How is this treated? Treatment varies depending on the type of vaginitis you have. Your treatment may include:  Antibiotic creams or pills to treat bacterial vaginosis and trichomoniasis.  Antifungal medicines, such as vaginal creams or suppositories, to treat a yeast infection.  Medicine to ease discomfort if you have viral vaginitis. Your sexual partner should also be treated.  Estrogen delivered in a cream, pill, suppository, or vaginal ring to treat atrophic vaginitis. If vaginal dryness occurs, lubricants and moisturizing creams may help. You may need to avoid scented soaps, sprays, or douches.  Stopping use of a product that is causing allergic vaginitis. Then using a vaginal cream to treat the symptoms. Follow these instructions at home: Lifestyle  Keep your genital area clean and dry. Avoid soap, and only rinse the area with water.  Do not douche or use tampons until your health care provider says it is okay to do so. Use sanitary pads, if needed.  Do not have sex until your health care provider approves. When you can return to sex, practice safe sex and use condoms.  Wipe from front to back. This avoids the spread of bacteria from the rectum to the vagina. General instructions  Take over-the-counter and prescription medicines only as told by your health care provider.  If you were prescribed an antibiotic medicine, take or use it as told by your health care provider. Do not stop taking or using the antibiotic even if you start to feel better.  Keep all follow-up visits as told by your health care provider. This is important. How is this  prevented?  Use mild, non-scented products. Do not use things that can irritate the vagina, such as fabric softeners. Avoid the following products if they are scented: ? Feminine sprays. ? Detergents. ? Tampons. ? Feminine hygiene products. ? Soaps or bubble baths.  Let air reach your genital area. ? Wear cotton underwear to reduce moisture buildup. ? Avoid wearing underwear while you sleep. ? Avoid wearing tight pants and underwear or nylons without a cotton panel. ? Avoid wearing thong underwear.  Take off any wet clothing, such as bathing suits, as soon as possible.  Practice safe sex and use condoms. Contact a health care provider if:  You have abdominal pain.  You have a fever.  You have symptoms that last for more than 2-3 days. Get help right away if:  You have a fever and your symptoms suddenly get worse. Summary  Vaginitis is a condition in which the vaginal tissue becomes inflamed.This condition is most often caused by a change in the normal  balance of bacteria and yeast that live in the vagina.  Treatment varies depending on the type of vaginitis you have.  Do not douche, use tampons , or have sex until your health care provider approves. When you can return to sex, practice safe sex and use condoms. This information is not intended to replace advice given to you by your health care provider. Make sure you discuss any questions you have with your health care provider. Document Revised: 11/02/2017 Document Reviewed: 12/26/2016 Elsevier Patient Education  2020 [employer].

## 2020-10-21 LAB — HIV ANTIBODY (ROUTINE TESTING W REFLEX): HIV Screen 4th Generation wRfx: NONREACTIVE

## 2020-10-21 LAB — URINALYSIS, MICROSCOPIC ONLY
Bacteria, UA: NONE SEEN
Casts: NONE SEEN /lpf
Epithelial Cells (non renal): NONE SEEN /hpf (ref 0–10)
RBC: NONE SEEN /hpf (ref 0–2)
WBC, UA: NONE SEEN /hpf (ref 0–5)

## 2020-10-21 LAB — CHLAMYDIA/GONOCOCCUS/TRICHOMONAS, NAA
Chlamydia by NAA: NEGATIVE
Gonococcus by NAA: NEGATIVE
Trich vag by NAA: NEGATIVE

## 2020-10-21 LAB — RPR: RPR Ser Ql: NONREACTIVE

## 2020-10-22 ENCOUNTER — Telehealth: Payer: Self-pay

## 2020-10-22 DIAGNOSIS — N949 Unspecified condition associated with female genital organs and menstrual cycle: Secondary | ICD-10-CM

## 2020-10-22 DIAGNOSIS — R102 Pelvic and perineal pain: Secondary | ICD-10-CM

## 2020-10-22 DIAGNOSIS — R1031 Right lower quadrant pain: Secondary | ICD-10-CM

## 2020-10-22 LAB — URINE CULTURE: Organism ID, Bacteria: NO GROWTH

## 2020-10-22 NOTE — Telephone Encounter (Signed)
Spoke with pt. Pt states needing to schedule PUS per Dr Oscar La. Per reviewed notes and orders, pt to have PUS for pelvic pain, right sided, cystic feeling mass.  Pt agreeable to schedule before benefits. Pt scheduled for PUS on 11/30 at 2 pm with Dr Oscar La. Pt agreeable to date and time of appt. Reviewed Cancellation policy and pt aware PUS is transvaginal.  Encounter closed  Cc: Hayley for precert

## 2020-10-22 NOTE — Telephone Encounter (Signed)
Patient says she is waiting on a call for ultrasound scheduling.

## 2020-10-22 NOTE — Addendum Note (Signed)
Addended by: Isabell Jarvis on: 10/22/2020 12:03 PM   Modules accepted: Orders

## 2020-10-25 ENCOUNTER — Telehealth: Payer: Self-pay

## 2020-10-25 NOTE — Telephone Encounter (Signed)
Call to patient. Per DPR, OK to leave message on voicemail.   Left voicemail requesting a return call to Natalie [name] to review benefits for scheduled Pelvic ultrasound with Jill Jertson, MD 

## 2020-11-01 NOTE — Telephone Encounter (Signed)
Spoke with pt. Pt states pain has resolved and no longer wants a PUS, but ok with OV for exam. Per reviewed notes per Dr Oscar La on  10/20/20: If pain resolves she will need an exam prior to canceling ultrasound. Pt states pain resolved completely over last weekend. Denies any pain, bleeding at this time.   Pt scheduled now for OV on 11/30 at 230pm. Pt agreeable to date and time of appt.  Routing to Dr Oscar La for update Encounter closed

## 2020-11-01 NOTE — Telephone Encounter (Signed)
Patient left message stating she wanted to cancel ultrasound for tomorrow (11/01/20). Patient stated that she was told she did not need to keep [place] due to not having pain anymore but wanted to know if she needed to keep office visit portion of appointment".

## 2020-11-02 ENCOUNTER — Other Ambulatory Visit: Payer: Self-pay

## 2020-11-02 ENCOUNTER — Other Ambulatory Visit: Payer: Self-pay | Admitting: Obstetrics and Gynecology

## 2020-11-02 ENCOUNTER — Encounter: Payer: Self-pay | Admitting: Obstetrics and Gynecology

## 2020-11-02 ENCOUNTER — Ambulatory Visit: Payer: 59 | Admitting: Obstetrics and Gynecology

## 2020-11-02 VITALS — BP 116/60 | HR 76 | Resp 16 | Ht 65.5 in | Wt 117.0 lb

## 2020-11-02 DIAGNOSIS — Z8742 Personal history of other diseases of the female genital tract: Secondary | ICD-10-CM | POA: Diagnosis not present

## 2020-11-02 DIAGNOSIS — N941 Unspecified dyspareunia: Secondary | ICD-10-CM | POA: Diagnosis not present

## 2020-11-02 NOTE — Progress Notes (Signed)
GYNECOLOGY  VISIT   HPI: 24 y.o.   Single White or Caucasian Not Hispanic or Latino  female   G0P0 with Patient's last menstrual period was 10/02/2020.   here for a f/u for right lower quadrant pain. Patient states that the pain is gone.    The patient was seen a few weeks ago with RLQ abdominal/pelvic pain. She was noted to have an ~5 cm cystic feeling mass in the right adnexa.   She c/o pain after intercourse starting at the end of last year. Penetration doesn't hurt, as soon as it is done she has pain in her vagina (also with use of fingers). The pain is an intense burning pain. She wonders if she was adequately lubricated. She was treated for BV a few weeks ago. She hasn't been sexually in the last 6 weeks.  No current symptoms.   GYNECOLOGIC HISTORY: Patient's last menstrual period was 10/02/2020. Contraception:abstinence Menopausal hormone therapy: n/a        OB History    Gravida  0   Para      Term      Preterm      AB      Living        SAB      TAB      Ectopic      Multiple      Live Births                 Patient Active Problem List   Diagnosis Date Noted  . Menorrhagia with regular cycle 07/28/2016    Class: Diagnosis of    Past Medical History:  Diagnosis Date  . Neurocardiogenic syncope     No past surgical history on file.  Current Outpatient Medications  Medication Sig Dispense Refill  . ibuprofen (ADVIL) 800 MG tablet Take 1 tablet (800 mg total) by mouth every 8 (eight) hours as needed. 30 tablet 1   No current facility-administered medications for this visit.     ALLERGIES: Avocado, Other, Shellfish allergy, Amoxicillin, Cinnamon, Penicillins, and Sulfa antibiotics  Family History  Problem Relation Age of Onset  . Stroke Maternal Grandmother   . Cancer Maternal Grandfather   . Diabetes Maternal Grandfather   . Stroke Maternal Grandfather   . Heart attack Maternal Grandfather   . Breast cancer Paternal Grandmother 66     Social History   Socioeconomic History  . Marital status: Single    Spouse name: Not on file  . Number of children: Not on file  . Years of education: Not on file  . Highest education level: Not on file  Occupational History  . Not on file  Tobacco Use  . Smoking status: Never Smoker  . Smokeless tobacco: Never Used  Vaping Use  . Vaping Use: Never used  Substance and Sexual Activity  . Alcohol use: Yes    Comment: 2 a month  . Drug use: No  . Sexual activity: Not Currently    Partners: Male    Birth control/protection: None  Other Topics Concern  . Not on file  Social History Narrative  . Not on file   Social Determinants of Health   Financial Resource Strain:   . Difficulty of Paying Living Expenses: Not on file  Food Insecurity:   . Worried About Programme researcher, broadcasting/film/video in the Last Year: Not on file  . Ran Out of Food in the Last Year: Not on file  Transportation Needs:   . Lack  of Transportation (Medical): Not on file  . Lack of Transportation (Non-Medical): Not on file  Physical Activity:   . Days of Exercise per Week: Not on file  . Minutes of Exercise per Session: Not on file  Stress:   . Feeling of Stress : Not on file  Social Connections:   . Frequency of Communication with Friends and Family: Not on file  . Frequency of Social Gatherings with Friends and Family: Not on file  . Attends Religious Services: Not on file  . Active Member of Clubs or Organizations: Not on file  . Attends Banker Meetings: Not on file  . Marital Status: Not on file  Intimate Partner Violence:   . Fear of Current or Ex-Partner: Not on file  . Emotionally Abused: Not on file  . Physically Abused: Not on file  . Sexually Abused: Not on file    Review of Systems  Constitutional: Negative.   HENT: Negative.   Eyes: Negative.   Respiratory: Negative.   Cardiovascular: Negative.   Gastrointestinal: Negative.   Genitourinary: Negative.   Musculoskeletal:  Negative.   Skin: Negative.   Neurological: Negative.   Endo/Heme/Allergies: Negative.   Psychiatric/Behavioral: Negative.     PHYSICAL EXAMINATION:    BP 116/60 (BP Location: Right Arm, Patient Position: Sitting, Cuff Size: Normal)   Pulse 76   Resp 16   Ht 5' 5.5" (1.664 m)   Wt 117 lb (53.1 kg)   LMP 10/02/2020   BMI 19.17 kg/m     General appearance: alert, cooperative and appears stated age  Pelvic: External genitalia:  no lesions              Urethra:  normal appearing urethra with no masses, tenderness or lesions              Bartholins and Skenes: normal                 Cervix: no cervical motion tenderness              Bimanual Exam:  Uterus:  normal size, contour, position, consistency, mobility, non-tender              Adnexa: no mass, fullness, tenderness              Pelvic floor: not tender  Chaperone was present for exam.  ASSESSMENT H/O pelvic pain and adnexal cyst, both have resolved H/O vaginal burning pain after penetration (penile or digital), no pain during penetration. She was recently treated for BV, BV could have contributed. Also discussed having adequate lubrication. Not currently sexually active.     PLAN Reassured the patient that her exam is normal. No ultrasound needed Make sure she uses lubricant with sexual activity If she continues to have vaginal burning, recommend that she come in after intercourse for evaluation.

## 2020-11-11 ENCOUNTER — Other Ambulatory Visit: Payer: 59

## 2020-11-15 ENCOUNTER — Ambulatory Visit: Payer: 59 | Admitting: Certified Nurse Midwife

## 2021-07-13 NOTE — Progress Notes (Signed)
25 y.o. G0P0 Single White or Caucasian Not Hispanic or Latino female here for annual exam.  Patient states that last Friday on the first day of her period while sitting and waiting for COVID results she passed out. Covid results were Neg. She says that this has never happened before.  She had a cold, she was sitting she was able to lie down before it happened. She was at a [facility]. Was seen by a NP, her BP was down, maybe dehydrated. She has a h/o neurocardiogenic syncope. She has an appointment to see a primary care provider in October.  Period Cycle (Days): 25 Period Duration (Days): 5 Menstrual Flow: Moderate Menstrual Control: Maxi pad Menstrual Control Change Freq (Hours): 3 Dysmenorrhea: (!) Moderate Dysmenorrhea Symptoms: Cramping, Diarrhea, Nausea She hasn't been sexually active since her last STD testing. Plans to use condoms after she gets married.   Patient's last menstrual period was 07/08/2021.          Sexually active: No.  The current method of family planning is none.    Exercising: No.  The patient does not participate in regular exercise at present. Smoker:  no  Health Maintenance: Pap:  10/23/17 WNL  History of abnormal Pap:  no MMG:  n/a BMD:   n/a Colonoscopy: none  TDaP:  2010  Gardasil: completed.    reports that she has never smoked. She has never used smokeless tobacco. She reports current alcohol use. She reports that she does not use drugs. Rare ETOH. Got engaged in July, getting married in November. Fiance works for [employer], goes from location to location every 6 weeks. Hope to settle somewhere in the next year.   Past Medical History:  Diagnosis Date   Neurocardiogenic syncope     History reviewed. No pertinent surgical history.  Current Outpatient Medications  Medication Sig Dispense Refill   ibuprofen (ADVIL) 800 MG tablet Take 1 tablet (800 mg total) by mouth every 8 (eight) hours as needed. 30 tablet 1   No current  facility-administered medications for this visit.    Family History  Problem Relation Age of Onset   Stroke Maternal Grandmother    Cancer Maternal Grandfather    Diabetes Maternal Grandfather    Stroke Maternal Grandfather    Heart attack Maternal Grandfather    Breast cancer Paternal Grandmother 62    Review of Systems  All other systems reviewed and are negative.  Exam:   BP 100/70   Pulse 73   Ht 5\' 5"  (1.651 m)   Wt 113 lb (51.3 kg)   LMP 07/08/2021   SpO2 100%   BMI 18.80 kg/m   Weight change: @WEIGHTCHANGE @ Height:   Height: 5\' 5"  (165.1 cm)  Ht Readings from Last 3 Encounters:  07/14/21 5\' 5"  (1.651 m)  11/02/20 5' 5.5" (1.664 m)  10/20/20 5' 5.5" (1.664 m)    General appearance: alert, cooperative and appears stated age Head: Normocephalic, without obvious abnormality, atraumatic Neck: no adenopathy, supple, symmetrical, trachea midline and thyroid normal to inspection and palpation Lungs: clear to auscultation bilaterally Cardiovascular: regular rate and rhythm Breasts: normal appearance, no masses or tenderness Abdomen: soft, non-tender; non distended,  no masses,  no organomegaly Extremities: extremities normal, atraumatic, no cyanosis or edema Skin: Skin color, texture, turgor normal. No rashes or lesions Lymph nodes: Cervical, supraclavicular, and axillary nodes normal. No abnormal inguinal nodes palpated Neurologic: Grossly normal   Pelvic: External genitalia:  no lesions  Urethra:  normal appearing urethra with no masses, tenderness or lesions              Bartholins and Skenes: normal                 Vagina: normal appearing vagina with normal color and discharge, no lesions              Cervix: no lesions, friable with pap               Bimanual Exam:  Uterus:  normal size, contour, position, consistency, mobility, non-tender              Adnexa: no mass, fullness, tenderness               Rectovaginal: Confirms               Anus:   normal sphincter tone, no lesions  Carolynn Serve chaperoned for the exam.  1. Well woman exam Discussed breast self exam Discussed calcium and vit D intake Declines contraception, will use condoms Declines STD testing Take a multivit with folic acid.   2. Screening for cervical cancer - Cytology - PAP  3. Laboratory exam ordered as part of routine general medical examination - CBC - Comprehensive metabolic panel - Lipid panel  4. Hair loss - TSH  5. History of syncope - CBC - Comprehensive metabolic panel  6. Immunization due - Tdap vaccine greater than or equal to 7yo IM

## 2021-07-14 ENCOUNTER — Ambulatory Visit (INDEPENDENT_AMBULATORY_CARE_PROVIDER_SITE_OTHER): Payer: 59 | Admitting: Obstetrics and Gynecology

## 2021-07-14 ENCOUNTER — Other Ambulatory Visit: Payer: Self-pay

## 2021-07-14 ENCOUNTER — Encounter: Payer: Self-pay | Admitting: Obstetrics and Gynecology

## 2021-07-14 ENCOUNTER — Other Ambulatory Visit (HOSPITAL_COMMUNITY)
Admission: RE | Admit: 2021-07-14 | Discharge: 2021-07-14 | Disposition: A | Discharge status: Home / Self Care | Payer: 59 | Source: Ambulatory Visit | Attending: Obstetrics and Gynecology | Admitting: Obstetrics and Gynecology

## 2021-07-14 VITALS — BP 100/70 | HR 73 | Ht 65.0 in | Wt 113.0 lb

## 2021-07-14 DIAGNOSIS — Z124 Encounter for screening for malignant neoplasm of cervix: Secondary | ICD-10-CM

## 2021-07-14 DIAGNOSIS — Z87898 Personal history of other specified conditions: Secondary | ICD-10-CM

## 2021-07-14 DIAGNOSIS — L659 Nonscarring hair loss, unspecified: Secondary | ICD-10-CM | POA: Diagnosis not present

## 2021-07-14 DIAGNOSIS — Z23 Encounter for immunization: Secondary | ICD-10-CM | POA: Diagnosis not present

## 2021-07-14 DIAGNOSIS — Z01419 Encounter for gynecological examination (general) (routine) without abnormal findings: Secondary | ICD-10-CM

## 2021-07-14 DIAGNOSIS — Z0000 Encounter for general adult medical examination without abnormal findings: Secondary | ICD-10-CM | POA: Diagnosis not present

## 2021-07-14 LAB — COMPREHENSIVE METABOLIC PANEL
AG Ratio: 1.4 (calc) (ref 1.0–2.5)
ALT: 7 U/L (ref 6–29)
AST: 13 U/L (ref 10–30)
Albumin: 4.3 g/dL (ref 3.6–5.1)
Alkaline phosphatase (APISO): 50 U/L (ref 31–125)
BUN: 9 mg/dL (ref 7–25)
CO2: 25 mmol/L (ref 20–32)
Calcium: 9.6 mg/dL (ref 8.6–10.2)
Chloride: 102 mmol/L (ref 98–110)
Creat: 0.59 mg/dL (ref 0.50–0.96)
Globulin: 3 g/dL (calc) (ref 1.9–3.7)
Glucose, Bld: 97 mg/dL (ref 65–99)
Potassium: 3.9 mmol/L (ref 3.5–5.3)
Sodium: 137 mmol/L (ref 135–146)
Total Bilirubin: 0.4 mg/dL (ref 0.2–1.2)
Total Protein: 7.3 g/dL (ref 6.1–8.1)

## 2021-07-14 LAB — CBC
HCT: 40.1 % (ref 35.0–45.0)
Hemoglobin: 12.6 g/dL (ref 11.7–15.5)
MCH: 25 pg — ABNORMAL LOW (ref 27.0–33.0)
MCHC: 31.4 g/dL — ABNORMAL LOW (ref 32.0–36.0)
MCV: 79.7 fL — ABNORMAL LOW (ref 80.0–100.0)
MPV: 10 fL (ref 7.5–12.5)
Platelets: 285 10*3/uL (ref 140–400)
RBC: 5.03 10*6/uL (ref 3.80–5.10)
RDW: 13.9 % (ref 11.0–15.0)
WBC: 4.8 10*3/uL (ref 3.8–10.8)

## 2021-07-14 LAB — LIPID PANEL
Cholesterol: 147 mg/dL (ref <200)
HDL: 55 mg/dL (ref >=50)
LDL Cholesterol (Calc): 78 mg/dL (calc)
Non-HDL Cholesterol (Calc): 92 mg/dL (calc) (ref <130)
Total CHOL/HDL Ratio: 2.7 (calc) (ref <5.0)
Triglycerides: 60 mg/dL (ref <150)

## 2021-07-14 LAB — TSH: TSH: 2.02 mIU/L

## 2021-07-14 NOTE — Patient Instructions (Signed)

## 2021-07-15 LAB — CYTOLOGY - PAP: Diagnosis: NEGATIVE

## 2021-08-23 ENCOUNTER — Other Ambulatory Visit: Payer: Self-pay

## 2021-08-23 ENCOUNTER — Encounter: Payer: Self-pay | Admitting: Obstetrics and Gynecology

## 2021-08-23 ENCOUNTER — Ambulatory Visit: Payer: 59 | Admitting: Obstetrics and Gynecology

## 2021-08-23 VITALS — BP 90/62 | HR 78 | Ht 65.0 in | Wt 110.0 lb

## 2021-08-23 DIAGNOSIS — F419 Anxiety disorder, unspecified: Secondary | ICD-10-CM | POA: Diagnosis not present

## 2021-08-23 DIAGNOSIS — Z659 Problem related to unspecified psychosocial circumstances: Secondary | ICD-10-CM

## 2021-08-23 MED ORDER — ESCITALOPRAM OXALATE 10 MG PO TABS
ORAL_TABLET | ORAL | 1 refills | Status: DC
  Filled 2021-08-23: qty 30, 30d supply, fill #0

## 2021-08-23 NOTE — Progress Notes (Signed)
GYNECOLOGY  VISIT   HPI: 25 y.o.   Single White or Caucasian Not Hispanic or Latino  female   G0P0 with Patient's last menstrual period was 08/16/2021.   here for PMS problems. She had really bad anxiety with her last cycle, was crying all the time, feeling disassociated with life.   Her symptoms started on 08/14/21, period started on 08/16/21 and it ended yesterday. The main symptom was severe anxiety, she had 2 panic attacks over the course of the week.  She felt like her brain wouldn't turn off. Felt different towards her fiance and her family.  Her anxiety was so severe that she had trouble eating and lost 5 lbs. In addition she was having trouble sleeping through the night and falling asleep. Her symptoms have improved, but not resolved.   She feels like she has always had mood changes prior to her cycle. She has never experienced symptoms like she did in the last few weeks.  he states that her mom has PMDD  She is getting married in 11/22, will be moving away. She went to visit him out of state for 2 weeks and was very homesick.   In the past she tried Celexa, was only on it for a few days. She felt muted on it.   GYNECOLOGIC HISTORY: Patient's last menstrual period was 08/16/2021. Contraception:none  Not sexually active.  Menopausal hormone therapy: none         OB History     Gravida  0   Para      Term      Preterm      AB      Living         SAB      IAB      Ectopic      Multiple      Live Births                 Patient Active Problem List   Diagnosis Date Noted   Menorrhagia with regular cycle 07/28/2016    Class: Diagnosis of    Past Medical History:  Diagnosis Date   Neurocardiogenic syncope     No past surgical history on file.  Current Outpatient Medications  Medication Sig Dispense Refill   ibuprofen (ADVIL) 800 MG tablet Take 1 tablet (800 mg total) by mouth every 8 (eight) hours as needed. 30 tablet 1   No current  facility-administered medications for this visit.     ALLERGIES: Avocado, Other, Shellfish allergy, Amoxicillin, Cinnamon, Penicillins, and Sulfa antibiotics  Family History  Problem Relation Age of Onset   Stroke Maternal Grandmother    Cancer Maternal Grandfather    Diabetes Maternal Grandfather    Stroke Maternal Grandfather    Heart attack Maternal Grandfather    Breast cancer Paternal Grandmother 30    Social History   Socioeconomic History   Marital status: Single    Spouse name: Not on file   Number of children: Not on file   Years of education: Not on file   Highest education level: Not on file  Occupational History   Not on file  Tobacco Use   Smoking status: Never   Smokeless tobacco: Never  Vaping Use   Vaping Use: Never used  Substance and Sexual Activity   Alcohol use: Yes    Comment: 2 a month   Drug use: No   Sexual activity: Not Currently    Partners: Male    Birth control/protection: None  Other Topics Concern   Not on file  Social History Narrative   Not on file   Social Determinants of Health   Financial Resource Strain: Not on file  Food Insecurity: Not on file  Transportation Needs: Not on file  Physical Activity: Not on file  Stress: Not on file  Social Connections: Not on file  Intimate Partner Violence: Not on file    Review of Systems  Psychiatric/Behavioral:  Positive for depression and suicidal ideas. The patient is nervous/anxious and has insomnia.   All other systems reviewed and are negative.  PHYSICAL EXAMINATION:    BP 90/62   Pulse 78   Ht 5\' 5"  (1.651 m)   Wt 110 lb (49.9 kg)   LMP 08/16/2021   SpO2 99%   BMI 18.30 kg/m     General appearance: alert, cooperative and appears stated age  41. Anxiety Recommended she see a therapist, names given - escitalopram (LEXAPRO) 10 MG tablet; Take 1/2 a tablet a day for one week, if tolerating after one week increase to one tablet a day  Dispense: 30 tablet; Refill: 1 -F/U in  one month  2. Other social stressor -Lots of life changes going on -Recommended counseling  ~28 minutes in total patient care

## 2021-08-23 NOTE — Patient Instructions (Signed)

## 2021-09-15 ENCOUNTER — Other Ambulatory Visit: Payer: Self-pay

## 2021-09-15 ENCOUNTER — Ambulatory Visit: Payer: 59 | Admitting: Family

## 2021-09-15 ENCOUNTER — Encounter: Payer: Self-pay | Admitting: Family

## 2021-09-15 DIAGNOSIS — Z8659 Personal history of other mental and behavioral disorders: Secondary | ICD-10-CM

## 2021-09-15 DIAGNOSIS — Z7689 Persons encountering health services in other specified circumstances: Secondary | ICD-10-CM | POA: Diagnosis not present

## 2021-09-15 NOTE — Assessment & Plan Note (Signed)
Recent CPE, labs w/GYN all wnl. Reports hx of anxiety but never started Lexapro. Discussed use & side effects if she changes her mind. No BC, planning to use natural method and condoms. Upcoming wedding and will be moving to [place]. Refusing Flu vaccine today, discussed risks/benefits.

## 2021-09-15 NOTE — Patient Instructions (Addendum)
Welcome to [facility]! It was very nice meeting you. I encourage you to sign up for MyChart, the instructions to do this are listed on this handout. Also encourage you to consider receiving the flu vaccine due to the prevalence we are seeing in the area. Best of luck to you with your upcoming wedding!

## 2021-09-15 NOTE — Progress Notes (Signed)
New Patient Office Visit  Subjective:  Patient ID: Natalie [name], female    DOB: [birth year]  Age: 25 y.o. MRN: [id number]  CC:  Chief Complaint  Patient presents with   Establish Care    No concerns today.    HPI Natalie [name] presents to establish care today. No conerns. Prescribed antidepressant medication but has not started, reports mainly for anxiety, but she is doing ok right now. Recently seen by GYN for physical with labs, all wnl. Getting married soon and will be moving to [place].  Past Medical History:  Diagnosis Date   Neurocardiogenic syncope     History reviewed. No pertinent surgical history.  Family History  Problem Relation Age of Onset   Stroke Maternal Grandmother    Cancer Maternal Grandfather    Diabetes Maternal Grandfather    Stroke Maternal Grandfather    Heart attack Maternal Grandfather    Breast cancer Paternal Grandmother 42    Social History   Socioeconomic History   Marital status: Single    Spouse name: Not on file   Number of children: Not on file   Years of education: Not on file   Highest education level: Not on file  Occupational History   Not on file  Tobacco Use   Smoking status: Never   Smokeless tobacco: Never  Vaping Use   Vaping Use: Never used  Substance and Sexual Activity   Alcohol use: Yes    Comment: 2 a month   Drug use: No   Sexual activity: Not Currently    Partners: Male    Birth control/protection: None  Other Topics Concern   Not on file  Social History Narrative   Not on file   Social Determinants of Health   Financial Resource Strain: Not on file  Food Insecurity: Not on file  Transportation Needs: Not on file  Physical Activity: Not on file  Stress: Not on file  Social Connections: Not on file  Intimate Partner Violence: Not on file    Objective:   Today's Vitals: BP 90/60   Pulse 73   Temp 98.3 F (36.8 C) (Temporal)   Ht 5\' 5"  (1.651 m)   Wt 111 lb (50.3 kg)   LMP 08/16/2021   SpO2  99%   BMI 18.47 kg/m   Physical Exam Vitals and nursing note reviewed.  Constitutional:      Appearance: Normal appearance.  Cardiovascular:     Rate and Rhythm: Normal rate and regular rhythm.  Pulmonary:     Effort: Pulmonary effort is normal.     Breath sounds: Normal breath sounds.  Musculoskeletal:        General: Normal range of motion.  Skin:    General: Skin is warm and dry.  Neurological:     Mental Status: She is alert.  Psychiatric:        Mood and Affect: Mood normal.        Behavior: Behavior normal.    Assessment & Plan:   Problem List Items Addressed This Visit       Other   Encounter to establish care with new doctor    Recent CPE, labs w/GYN all wnl. Reports hx of anxiety but never started Lexapro. Discussed use & side effects if she changes her mind. No BC, planning to use natural method and condoms. Upcoming wedding and will be moving to 08/18/2021. Refusing Flu vaccine today, discussed risks/benefits.        Outpatient Encounter Medications as  of 09/15/2021  Medication Sig   ibuprofen (ADVIL) 800 MG tablet Take 1 tablet (800 mg total) by mouth every 8 (eight) hours as needed.   escitalopram (LEXAPRO) 10 MG tablet Take 1/2 a tablet a day for one week, if tolerating after one week increase to one tablet a day (Patient not taking: Reported on 09/15/2021)   No facility-administered encounter medications on file as of 09/15/2021.    Follow-up: as needed.  Dulce Sellar, NP

## 2021-09-22 ENCOUNTER — Ambulatory Visit: Payer: 59 | Admitting: Family

## 2021-09-22 ENCOUNTER — Ambulatory Visit: Payer: 59 | Admitting: Physician Assistant

## 2021-10-13 ENCOUNTER — Telehealth: Payer: Self-pay

## 2021-10-13 NOTE — Telephone Encounter (Signed)
Spoke with patient and informed her. °

## 2021-10-13 NOTE — Telephone Encounter (Signed)
Please congratulate her for me. I thinks she should be seen to make sure nothing else is going on. I would recommend she be seen at Urgent care.

## 2021-10-13 NOTE — Telephone Encounter (Signed)
Patient called. Just got married this past weekend. Living in [place] now. Think she has BV. She states she had it last year as well and was treated by Dr. Oscar La. Not yet established with a GYN as just moved there.  Sx: "Odor is really foul". It's the worst sx. So bad it is keeping them from having intercourse.         Vag Discharge yellow at times          No other symptoms.  She asked if Dr. Oscar La would be willing to treat her over the phone in light of her circumstances?

## 2021-11-24 ENCOUNTER — Ambulatory Visit: Payer: 59 | Admitting: Obstetrics and Gynecology

## 2021-11-24 ENCOUNTER — Other Ambulatory Visit: Payer: Self-pay

## 2021-11-24 ENCOUNTER — Encounter: Payer: Self-pay | Admitting: Obstetrics and Gynecology

## 2021-11-24 VITALS — BP 112/60 | HR 76 | Ht 65.0 in | Wt 113.0 lb

## 2021-11-24 DIAGNOSIS — Z8742 Personal history of other diseases of the female genital tract: Secondary | ICD-10-CM

## 2021-11-24 DIAGNOSIS — Z3169 Encounter for other general counseling and advice on procreation: Secondary | ICD-10-CM

## 2021-11-24 NOTE — Patient Instructions (Signed)
Preparing for Pregnancy °If you are planning to become pregnant, talk to your health care provider about preconception care. This type of care helps you prepare for a safe and healthy pregnancy. During this visit, your health care provider will: °Do a complete physical exam, including a Pap test. °Take your complete medical history. °Give you information, answer your questions, and help you resolve problems. °Preconception checklist °Medical history °Tell your health care provider about any medical conditions you have or have had. Your pregnancy or your ability to become pregnant may be affected by long-term (chronic) conditions, such as: °Diabetes. °High blood pressure (hypertension). °Thyroid problems. °Tell your health care provider about your family's medical history and your partner's medical history. °Tell your health care provider if you have or have had any sexually transmitted infections, orSTIs. These can affect your pregnancy. In some cases, they can be passed to your baby. °If needed, discuss the benefits of genetic testing. This test checks for conditions that may be passed from parent to child. °Tell your health care provider about: °Any problems you had getting pregnant or while pregnant. °Any medicines you take. These include vitamins, herbal supplements, and over-the-counter medicines. °Your history of getting vaccines. Discuss any vaccines that you may need. °Diet °Ask your health care provider about what foods to eat in order to get a balance of nutrients. This is especially important when you are pregnant or preparing to become pregnant. It is recommended that women of childbearing age take a folic acid supplement of 400 mcg daily and eat foods rich in folic acid to prevent certain birth defects. °Ask your health care provider to help you reach a healthy weight before pregnancy. °If you are overweight, you may have a higher risk for certain problems. These include hypertension, diabetes, and  early (preterm) birth. °If you are underweight, you are more likely to have a baby who has a low birth weight. °Lifestyle, work, and home °Let your health care provider know about: °Any lifestyle habits that you have, such as use of alcohol, drugs, or tobacco products. °Fun and leisure activities that may put you at risk during pregnancy, such as downhill skiing and certain exercise programs. °Any plans to travel out of the country, especially to places with an active Zika virus outbreak. °Harmful substances that you may be exposed to at work or at home. These include chemicals, pesticides, radiation, and substances from cat litter boxes. °Any concerns you have for your safety at home. °Mental health °Tell your health care provider about: °Any history of mental health conditions, including feelings of depression, sadness, or anxiety. °Any medicines that you take for a mental health condition. These include herbs and supplements. °How do I know that I am pregnant? °You may be pregnant if you have been sexually active and you miss your period. Other symptoms of early pregnancy include: °Mild cramping. °Very light vaginal bleeding (spotting). °Feeling more tired than usual. °Nausea and vomiting. These may be signs of morning sickness. °Take a home pregnancy test if you have any of these symptoms. This test checks for a hormone in your urine called human chorionic gonadotropin, or hCG. A woman's body begins to make this hormone during early pregnancy. These tests are very accurate. °Wait until at least the first day after you miss your period to take a home pregnancy test. If the test shows that you are pregnant, call your health care provider for a prenatal care visit. °What should I do if I become pregnant? °Schedule a   visit with your health care provider as soon as you suspect you are pregnant. °Talk to your health care provider if you are taking prescription medicines to determine if they are safe to take during  pregnancy. °You may continue to have sex if it does not cause pain or other problems, such as vaginal bleeding. °Follow these instructions at home: °Eating and drinking ° °Follow instructions from your health care provider about eating or drinking restrictions. °Drink enough fluid to keep your urine pale yellow. °Eat a balanced diet. This includes fresh fruits and vegetables, whole grains, lean meats, low-fat dairy products, healthy fats, and foods that are high in fiber. Ask to meet with a nutritionist or registered dietitian for help with meal planning and goals. °Avoid eating raw or undercooked meat and seafood. °Avoid eating or drinking unpasteurized dairy products. °Lifestyle °  °Get regular exercise. Try to be active for at least 30 minutes a day on most days of the week. Ask your health care provider which activities are safe during pregnancy. °Maintain a healthy weight. °Avoid toxic fumes and chemicals. °Avoid cleaning cat litter boxes. Cat feces may contain a harmful parasite called toxoplasma. °Avoid travel to countries where Zika virus is common. °Do not use any products that contain nicotine or tobacco, such as cigarettes, e-cigarettes, and chewing tobacco. If you need help quitting, ask your health care provider. °Do not drink alcohol or use drugs. °General instructions °Keep an accurate record of your menstrual periods. This makes it easier for your health care provider to determine your baby's due date. °Take over-the-counter and prescription medicines only as told by your health care provider. °Begin taking prenatal vitamins and folic acid supplements daily as directed. °Manage any chronic conditions, such as hypertension and diabetes, as told by your health care provider. This is important. °Summary °If you are planning to become pregnant, talk to your health care provider about preconception care. This is an important part of planning for a healthy pregnancy. °Women of childbearing age should take  400 mcg of folic acid daily in addition to eating a diet rich in folic acid. This will prevent certain birth defects. °Schedule a visit with your health care provider as soon as you suspect you are pregnant. Tell your health care provider about your medical history, lifestyle activities, home safety, and other things that may concern you. °This information is not intended to replace advice given to you by your health care provider. Make sure you discuss any questions you have with your health care provider. °Document Revised: 08/20/2019 Document Reviewed: 08/20/2019 °Elsevier Patient Education © 2022 [employer] Inc[employer] ° °

## 2021-11-24 NOTE — Progress Notes (Signed)
GYNECOLOGY  VISIT   HPI: 25 y.o.   Single White or Caucasian Not Hispanic or Latino  female   G0P0 with Patient's last menstrual period was 11/18/2021.   here for f/u of a ruptured ovarian cyst. Patient states that she was seen at a hospital in [place] on November 06 2021 and was told she had a ruptured cyst.  She is now living in [place], visiting her family for the holidays.  She was told she had a benign ruptured cyst. Her pain has resolved  She was seen in 9/22 for anxiety. Her anxiety is better, she never started the lexapro. She got married in 11/22.   Considering pregnancy, sometimes using condoms.  Cycles are monthly x 5-6 days. Uses 3-4 pads in 24 hours. Mild cramps. She does have molimina symptoms.   No h/o STD's.  Husband is 6, healthy.   No family history of genetic or chromosomal abnormalities.   She has had the varicella vaccinations.   GYNECOLOGIC HISTORY: Patient's last menstrual period was 11/18/2021. Contraception:none   Menopausal hormone therapy: none         OB History     Gravida  0   Para      Term      Preterm      AB      Living         SAB      IAB      Ectopic      Multiple      Live Births                 Patient Active Problem List   Diagnosis Date Noted   Encounter to establish care with new doctor 09/15/2021   Menorrhagia with regular cycle 07/28/2016    Class: Diagnosis of    Past Medical History:  Diagnosis Date   Allergy    Anemia    Anxiety    Depression    Neurocardiogenic syncope    Substance abuse (HCC)     No past surgical history on file.  Current Outpatient Medications  Medication Sig Dispense Refill   ibuprofen (ADVIL) 800 MG tablet Take 1 tablet (800 mg total) by mouth every 8 (eight) hours as needed. 30 tablet 1   No current facility-administered medications for this visit.     ALLERGIES: Avocado, Other, Shellfish allergy, Amoxicillin, Cinnamon, Penicillins, and Sulfa antibiotics  Family History   Problem Relation Age of Onset   Stroke Maternal Grandmother    Cancer Maternal Grandfather    Diabetes Maternal Grandfather    Stroke Maternal Grandfather    Heart attack Maternal Grandfather    Breast cancer Paternal Grandmother 70    Social History   Socioeconomic History   Marital status: Single    Spouse name: Not on file   Number of children: Not on file   Years of education: Not on file   Highest education level: Not on file  Occupational History   Not on file  Tobacco Use   Smoking status: Never   Smokeless tobacco: Never  Vaping Use   Vaping Use: Never used  Substance and Sexual Activity   Alcohol use: Yes    Comment: 2 a month   Drug use: No   Sexual activity: Not Currently    Partners: Male    Birth control/protection: None  Other Topics Concern   Not on file  Social History Narrative   Not on file   Social Determinants of Health  Financial Resource Strain: Not on file  Food Insecurity: Not on file  Transportation Needs: Not on file  Physical Activity: Not on file  Stress: Not on file  Social Connections: Not on file  Intimate Partner Violence: Not on file    Review of Systems  All other systems reviewed and are negative.  PHYSICAL EXAMINATION:    BP 112/60    Pulse 76    Ht 5\' 5"  (1.651 m)    Wt 113 lb (51.3 kg)    LMP 11/18/2021    SpO2 98%    BMI 18.80 kg/m     General appearance: alert, cooperative and appears stated age   Pelvic: External genitalia:  no lesions              Urethra:  normal appearing urethra with no masses, tenderness or lesions              Bartholins and Skenes: normal                 Vagina: normal appearing vagina with normal color and discharge, no lesions              Cervix: no cervical motion tenderness              Bimanual Exam:  Uterus:  normal size, contour, position, consistency, mobility, non-tender and anteverted              Adnexa: no mass, fullness, tenderness               Chaperone was present for  exam.  1. History of ovarian cyst H/O ruptured ovarian cyst, symptoms have resolved, exam is normal.  Patient reassured   2. Encounter for preconception consultation Discussed, information given - Rubella screen -Discussed avoiding ETOH, medications -Start prenatal vitamins -Discussed timing of intercourse  ~20 minutes in total patient care

## 2021-11-25 LAB — RUBELLA SCREEN: Rubella: 4.89 Index

## 2022-05-22 ENCOUNTER — Other Ambulatory Visit: Payer: Self-pay | Admitting: Obstetrics & Gynecology

## 2022-05-23 ENCOUNTER — Ambulatory Visit (HOSPITAL_BASED_OUTPATIENT_CLINIC_OR_DEPARTMENT_OTHER): Payer: BC Managed Care – PPO | Admitting: Anesthesiology

## 2022-05-23 ENCOUNTER — Other Ambulatory Visit: Payer: Self-pay

## 2022-05-23 ENCOUNTER — Ambulatory Visit (HOSPITAL_BASED_OUTPATIENT_CLINIC_OR_DEPARTMENT_OTHER)
Admission: RE | Admit: 2022-05-23 | Discharge: 2022-05-23 | Disposition: A | Discharge status: Home / Self Care | Payer: BC Managed Care – PPO | Attending: Obstetrics & Gynecology | Admitting: Obstetrics & Gynecology

## 2022-05-23 ENCOUNTER — Encounter (HOSPITAL_BASED_OUTPATIENT_CLINIC_OR_DEPARTMENT_OTHER)
Admission: RE | Disposition: A | Discharge status: Home / Self Care | Payer: Self-pay | Source: Home / Self Care | Attending: Obstetrics & Gynecology

## 2022-05-23 ENCOUNTER — Encounter (HOSPITAL_BASED_OUTPATIENT_CLINIC_OR_DEPARTMENT_OTHER): Payer: Self-pay | Admitting: Obstetrics & Gynecology

## 2022-05-23 DIAGNOSIS — O021 Missed abortion: Secondary | ICD-10-CM | POA: Diagnosis not present

## 2022-05-23 DIAGNOSIS — Z01818 Encounter for other preprocedural examination: Secondary | ICD-10-CM

## 2022-05-23 DIAGNOSIS — R197 Diarrhea, unspecified: Secondary | ICD-10-CM | POA: Insufficient documentation

## 2022-05-23 HISTORY — PX: OPERATIVE ULTRASOUND: SHX5996

## 2022-05-23 HISTORY — DX: Missed abortion: O02.1

## 2022-05-23 HISTORY — PX: DILATION AND EVACUATION: SHX1459

## 2022-05-23 LAB — CBC
HCT: 40.2 % (ref 36.0–46.0)
Hemoglobin: 13.6 g/dL (ref 12.0–15.0)
MCH: 28.2 pg (ref 26.0–34.0)
MCHC: 33.8 g/dL (ref 30.0–36.0)
MCV: 83.4 fL (ref 80.0–100.0)
Platelets: 228 10*3/uL (ref 150–400)
RBC: 4.82 MIL/uL (ref 3.87–5.11)
RDW: 15 % (ref 11.5–15.5)
WBC: 6.7 10*3/uL (ref 4.0–10.5)
nRBC: 0 % (ref 0.0–0.2)

## 2022-05-23 SURGERY — DILATION AND EVACUATION, UTERUS
Anesthesia: General

## 2022-05-23 MED ORDER — IBUPROFEN 200 MG PO TABS: 600.0000 mg | ORAL_TABLET | Freq: Four times a day (QID) | ORAL | 0 refills | Status: AC | PRN

## 2022-05-23 MED ORDER — LACTATED RINGERS IV SOLN: INTRAVENOUS | Status: DC

## 2022-05-23 MED ORDER — POVIDONE-IODINE 10 % EX SWAB: 2.0000 | Freq: Once | CUTANEOUS | Status: DC

## 2022-05-23 MED ORDER — LIDOCAINE 2% (20 MG/ML) 5 ML SYRINGE
INTRAMUSCULAR | Status: DC | PRN
  Administered 2022-05-23: 40 mg via INTRAVENOUS

## 2022-05-23 MED ORDER — SCOPOLAMINE 1 MG/3DAYS TD PT72
1.0000 | MEDICATED_PATCH | TRANSDERMAL | Status: DC
Start: 2022-05-23 — End: 2022-05-23
  Administered 2022-05-23: 1.5 mg via TRANSDERMAL

## 2022-05-23 MED ORDER — GENTAMICIN SULFATE 40 MG/ML IJ SOLN
5.0000 mg/kg | INTRAVENOUS | Status: DC
  Filled 2022-05-23: qty 6.5

## 2022-05-23 MED ORDER — MIDAZOLAM HCL 2 MG/2ML IJ SOLN
INTRAMUSCULAR | Status: DC | PRN
  Administered 2022-05-23: 2 mg via INTRAVENOUS

## 2022-05-23 MED ORDER — OXYCODONE HCL 5 MG PO TABS: 5.0000 mg | ORAL_TABLET | Freq: Once | ORAL | Status: DC | PRN

## 2022-05-23 MED ORDER — PROPOFOL 10 MG/ML IV BOLUS
INTRAVENOUS | Status: AC
  Filled 2022-05-23: qty 20

## 2022-05-23 MED ORDER — FENTANYL CITRATE (PF) 100 MCG/2ML IJ SOLN
INTRAMUSCULAR | Status: DC | PRN
  Administered 2022-05-23 (×2): 50 ug via INTRAVENOUS

## 2022-05-23 MED ORDER — PROPOFOL 10 MG/ML IV BOLUS
INTRAVENOUS | Status: DC | PRN
  Administered 2022-05-23: 50 mg via INTRAVENOUS
  Administered 2022-05-23: 200 mg via INTRAVENOUS

## 2022-05-23 MED ORDER — EPHEDRINE SULFATE-NACL 50-0.9 MG/10ML-% IV SOSY
PREFILLED_SYRINGE | INTRAVENOUS | Status: DC | PRN
  Administered 2022-05-23: 5 mg via INTRAVENOUS
  Administered 2022-05-23 (×2): 10 mg via INTRAVENOUS

## 2022-05-23 MED ORDER — MIDAZOLAM HCL 2 MG/2ML IJ SOLN: 0.5000 mg | Freq: Once | INTRAMUSCULAR | Status: DC | PRN

## 2022-05-23 MED ORDER — DEXAMETHASONE SODIUM PHOSPHATE 10 MG/ML IJ SOLN
INTRAMUSCULAR | Status: AC
  Filled 2022-05-23: qty 1

## 2022-05-23 MED ORDER — OXYCODONE HCL 5 MG/5ML PO SOLN: 5.0000 mg | Freq: Once | ORAL | Status: DC | PRN

## 2022-05-23 MED ORDER — CHLOROPROCAINE HCL 1 % IJ SOLN
INTRAMUSCULAR | Status: DC | PRN
  Administered 2022-05-23: 20 mL

## 2022-05-23 MED ORDER — ACETAMINOPHEN 500 MG PO TABS: 500.0000 mg | ORAL_TABLET | Freq: Four times a day (QID) | ORAL | 0 refills | Status: AC | PRN

## 2022-05-23 MED ORDER — METHYLERGONOVINE MALEATE 0.2 MG/ML IJ SOLN
INTRAMUSCULAR | Status: DC | PRN
  Administered 2022-05-23: .2 mg via INTRAMUSCULAR

## 2022-05-23 MED ORDER — PROMETHAZINE HCL 25 MG/ML IJ SOLN: 6.2500 mg | INTRAMUSCULAR | Status: DC | PRN

## 2022-05-23 MED ORDER — KETOROLAC TROMETHAMINE 30 MG/ML IJ SOLN
INTRAMUSCULAR | Status: AC
  Filled 2022-05-23: qty 1

## 2022-05-23 MED ORDER — MEPERIDINE HCL 25 MG/ML IJ SOLN
INTRAMUSCULAR | Status: AC
  Filled 2022-05-23: qty 1

## 2022-05-23 MED ORDER — FENTANYL CITRATE (PF) 100 MCG/2ML IJ SOLN: 25.0000 ug | INTRAMUSCULAR | Status: DC | PRN

## 2022-05-23 MED ORDER — SCOPOLAMINE 1 MG/3DAYS TD PT72
MEDICATED_PATCH | TRANSDERMAL | Status: AC
  Filled 2022-05-23: qty 1

## 2022-05-23 MED ORDER — ONDANSETRON HCL 4 MG/2ML IJ SOLN
INTRAMUSCULAR | Status: DC | PRN
  Administered 2022-05-23: 4 mg via INTRAVENOUS

## 2022-05-23 MED ORDER — KETOROLAC TROMETHAMINE 30 MG/ML IJ SOLN
INTRAMUSCULAR | Status: DC | PRN
  Administered 2022-05-23: 30 mg via INTRAVENOUS

## 2022-05-23 MED ORDER — ACETAMINOPHEN 500 MG PO TABS
1000.0000 mg | ORAL_TABLET | Freq: Once | ORAL | Status: AC
Start: 2022-05-23 — End: 2022-05-23
  Administered 2022-05-23: 1000 mg via ORAL

## 2022-05-23 MED ORDER — ACETAMINOPHEN 500 MG PO TABS
ORAL_TABLET | ORAL | Status: AC
  Filled 2022-05-23: qty 2

## 2022-05-23 MED ORDER — VANCOMYCIN HCL IN DEXTROSE 1-5 GM/200ML-% IV SOLN: 1000.0000 mg | Freq: Once | INTRAVENOUS | Status: DC

## 2022-05-23 MED ORDER — MEPERIDINE HCL 25 MG/ML IJ SOLN
6.2500 mg | INTRAMUSCULAR | Status: DC | PRN
  Administered 2022-05-23: 12.5 mg via INTRAVENOUS

## 2022-05-23 MED ORDER — DEXAMETHASONE SODIUM PHOSPHATE 10 MG/ML IJ SOLN
INTRAMUSCULAR | Status: DC | PRN
  Administered 2022-05-23: 10 mg via INTRAVENOUS

## 2022-05-23 MED ORDER — MIDAZOLAM HCL 2 MG/2ML IJ SOLN
INTRAMUSCULAR | Status: AC
  Filled 2022-05-23: qty 2

## 2022-05-23 MED ORDER — SODIUM CHLORIDE 0.9 % IV SOLN
100.0000 mg | Freq: Once | INTRAVENOUS | Status: AC
Start: 2022-05-23 — End: 2022-05-23
  Administered 2022-05-23: 100 mg via INTRAVENOUS
  Filled 2022-05-23: qty 100

## 2022-05-23 MED ORDER — 0.9 % SODIUM CHLORIDE (POUR BTL) OPTIME
TOPICAL | Status: DC | PRN
  Administered 2022-05-23: 1000 mL

## 2022-05-23 MED ORDER — ONDANSETRON HCL 4 MG/2ML IJ SOLN
INTRAMUSCULAR | Status: AC
  Filled 2022-05-23: qty 2

## 2022-05-23 MED ORDER — FENTANYL CITRATE (PF) 100 MCG/2ML IJ SOLN
INTRAMUSCULAR | Status: AC
  Filled 2022-05-23: qty 2

## 2022-05-23 SURGICAL SUPPLY — 22 items
CATH SILICONE 16FRX5CC (CATHETERS) ×1 IMPLANT
DECANTER SPIKE VIAL GLASS SM (MISCELLANEOUS) ×3 IMPLANT
DRSG TELFA 3X8 NADH (GAUZE/BANDAGES/DRESSINGS) ×2 IMPLANT
GAUZE 4X4 16PLY ~~LOC~~+RFID DBL (SPONGE) ×3 IMPLANT
GLOVE BIO SURGEON STRL SZ7 (GLOVE) ×3 IMPLANT
GLOVE BIOGEL PI IND STRL 7.0 (GLOVE) ×4 IMPLANT
GLOVE BIOGEL PI INDICATOR 7.0 (GLOVE) ×2
GOWN STRL REUS W/TWL LRG LVL3 (GOWN DISPOSABLE) ×6 IMPLANT
KIT BERKELEY 1ST TRI 3/8 NO TR (MISCELLANEOUS) ×3 IMPLANT
KIT BERKELEY 1ST TRIMESTER 3/8 (MISCELLANEOUS) ×3 IMPLANT
KIT TURNOVER CYSTO (KITS) ×3 IMPLANT
NS IRRIG 1000ML POUR BTL (IV SOLUTION) ×3 IMPLANT
PACK VAGINAL MINOR WOMEN LF (CUSTOM PROCEDURE TRAY) ×3 IMPLANT
PAD DRESSING TELFA 3X8 NADH (GAUZE/BANDAGES/DRESSINGS) ×2 IMPLANT
PAD OB MATERNITY 4.3X12.25 (PERSONAL CARE ITEMS) ×3 IMPLANT
PAD PREP 24X48 CUFFED NSTRL (MISCELLANEOUS) ×3 IMPLANT
SET BERKELEY SUCTION TUBING (SUCTIONS) ×3 IMPLANT
TOWEL OR 17X26 10 PK STRL BLUE (TOWEL DISPOSABLE) ×6 IMPLANT
VACURETTE 10 RIGID CVD (CANNULA) IMPLANT
VACURETTE 7MM CVD STRL WRAP (CANNULA) IMPLANT
VACURETTE 8 RIGID CVD (CANNULA) ×1 IMPLANT
VACURETTE 9 RIGID CVD (CANNULA) ×1 IMPLANT

## 2022-05-23 NOTE — Anesthesia Postprocedure Evaluation (Signed)
Anesthesia Post Note  Patient: NEIMA LACROSS  Procedure(s) Performed: DILATATION AND EVACUATION OPERATIVE ULTRASOUND     Patient location during evaluation: PACU Anesthesia Type: General Level of consciousness: patient cooperative, oriented and sedated Pain management: pain level controlled Vital Signs Assessment: post-procedure vital signs reviewed and stable Respiratory status: spontaneous breathing, nonlabored ventilation and respiratory function stable Cardiovascular status: blood pressure returned to baseline and stable Postop Assessment: no apparent nausea or vomiting Anesthetic complications: no   No notable events documented.  Last Vitals:  Vitals:   05/23/22 1215 05/23/22 1230  BP: (!) 110/56 109/63  Pulse: 100 98  Resp: (!) 22 (!) 23  Temp:    SpO2: 100% 100%    Last Pain:  Vitals:   05/23/22 1230  TempSrc:   PainSc: 0-No pain                 Krupa Stege,E. Cleva Camero

## 2022-05-23 NOTE — Op Note (Signed)
05/23/22 Preoperative diagnosis: Missed miscarriage 10 weeks  Postop diagnosis: as above.  Procedure: Suction dilatation and evacuation Anesthesia General LMA Surgeon: Shea Evans, MD Assistant: none  IV fluids 800 cc LR Estimated blood loss 50 cc  Urine output:  100 cc straight catheter preop Complications none Condition stable Disposition PACU  Specimen: products of conception sent to pathology.  Procedure Indication: Decision was made to proceed with suction D&E due to failed medical option with Cytotec.  Risks/ complications of surgery including infection, bleeding, damage to internal organs including uterine perforation, Asherman syndrome were reviewed. She voiced understanding and gave informed written consent.  Patient was brought to the operating room with IV running. She received preop 100 mg Doxycycline. She underwent general anesthesia by LMA without complications. She was given dorsolithotomy position. Parts were prepped and draped in standard fashion. Bladder was catheterized once.  Bimanual exam revealed uterus to be anteverted and 10 week size.  Speculum was placed and cervix was grasped with single-tooth tenaculum. Paracervical block placed with 20 cc Nesacaine -plain. Cervical os was dilated to 25 [place] dilator. A #8 suction curette was introduced in the uterine cavity and suction evacuation of products of conception was done in multiple step wise fashion. Gentle sharp curettage was performed. Suction cannula was reintroduced and uterine contents emptied.  Patient was given Methergine 0.2 mg IM for prophylaxis. The bleeding was controlled well and uterus was firm on exam.  At this point the procedure was complete.  All counts are correct x2. Specimen products of conception. No complications. Patient was made supine dorsal anesthesia and brought to the recovery room in stable condition.  Patient will be discharged home today. Post op care discussed with family.   I  performed this procedure Shea Evans, MD.   [facility]

## 2022-05-23 NOTE — OR Nursing (Signed)
OPERATIVE ULTRASOUND ADDED AFTER TIME OUT AND PRIOR TO CASE END

## 2022-05-23 NOTE — Discharge Instructions (Addendum)
NO TYLENOL PRODUCTS UNTIL 4:00 PM TODAY.  NO IBUPROFEN PRODUCTS UNTIL 5:00 PM TODAY. (IBUPROFEN, ADVIL, MOTRIN, ALEVE, NAPROXEN, CELEBREX)     Post Anesthesia Home Care Instructions  Activity: Get plenty of rest for the remainder of the day. A responsible individual must stay with you for 24 hours following the procedure.  For the next 24 hours, DO NOT: -Drive a car -Advertising copywriter -Drink alcoholic beverages -Take any medication unless instructed by your physician -Make any legal decisions or sign important papers.  Meals: Start with liquid foods such as gelatin or soup. Progress to regular foods as tolerated. Avoid greasy, spicy, heavy foods. If nausea and/or vomiting occur, drink only clear liquids until the nausea and/or vomiting subsides. Call your physician if vomiting continues.  Special Instructions/Symptoms: Your throat may feel dry or sore from the anesthesia or the breathing tube placed in your throat during surgery. If this causes discomfort, gargle with warm salt water. The discomfort should disappear within 24 hours.  If you had a scopolamine patch placed behind your ear for the management of post- operative nausea and/or vomiting:  1. The medication in the patch is effective for 72 hours, after which it should be removed.  Wrap patch in a tissue and discard in the trash. Wash hands thoroughly with soap and water. 2. You may remove the patch earlier than 72 hours if you experience unpleasant side effects which may include dry mouth, dizziness or visual disturbances. 3. Avoid touching the patch. Wash your hands with soap and water after contact with the patch.

## 2022-05-23 NOTE — Transfer of Care (Signed)
Immediate Anesthesia Transfer of Care Note  Patient: Natalie [name]  Procedure(s) Performed: Procedure(s) (LRB): DILATATION AND EVACUATION (N/A) OPERATIVE ULTRASOUND (N/A)  Patient Location: PACU  Anesthesia Type: General  Level of Consciousness: awake, alert  and oriented  Airway & Oxygen Therapy: Patient Spontanous Breathing   Post-op Assessment: Report given to PACU RN and Post -op Vital signs reviewed and stable  Post vital signs: Reviewed and stable  Complications: No apparent anesthesia complications  Last Vitals:  Vitals Value Taken Time  BP 103/58 05/23/22 1153  Temp    Pulse 87 05/23/22 1156  Resp 15 05/23/22 1156  SpO2 100 % 05/23/22 1156  Vitals shown include unvalidated device data.  Last Pain:  Vitals:   05/23/22 0938  TempSrc: Oral  PainSc: 0-No pain      Patients Stated Pain Goal: 5 (05/23/22 5885)  Complications: No notable events documented.

## 2022-05-23 NOTE — H&P (Signed)
Natalie [name] is an 26 y.o. female with missed abortion diagnosed at 10 week visit, measuring 9 wk and absent FHT by sono.  Pt tried Cytotec regimen (took PO) and had diarrhea but vaginal bleeding. She lives in [place], here w/ family and desires surgical assistance to complete the miscarriage  Healthy female. Nl Pap hx. Married.   No LMP recorded.    Past Medical History:  Diagnosis Date   Allergy    Anemia    Anxiety    Depression    Neurocardiogenic syncope    Substance abuse (HCC)     No past surgical history on file.  Family History  Problem Relation Age of Onset   Stroke Maternal Grandmother    Cancer Maternal Grandfather    Diabetes Maternal Grandfather    Stroke Maternal Grandfather    Heart attack Maternal Grandfather    Breast cancer Paternal Grandmother 60    Social History:  reports that she has never smoked. She has never used smokeless tobacco. She reports current alcohol use. She reports that she does not use drugs.  Allergies:  Allergies  Allergen Reactions   Avocado      cantelope & honey dew also make Mouth & tongue itchy   Other Hives    Food allergies: Nuts,wheat,   Shellfish Allergy Hives   Amoxicillin Rash   Cinnamon Rash   Penicillins Rash    DID THE REACTION INVOLVE: Swelling of the face/tongue/throat, SOB, or low BP? Yes Sudden or severe rash/hives, skin peeling, or the inside of the mouth or nose? Yes Did it require medical treatment? No When did it last happen?       If all above answers are "NO", may proceed with cephalosporin use.     Sulfa Antibiotics Rash    No medications prior to admission.    Review of Systems diarrhea from Cytotec   There were no vitals taken for this visit. Physical Exam  A&O x 3, no acute distress. Pleasant HEENT neg, no thyromegaly Lungs CTA bilat CV RRR, S1S2 normal Abdo soft, non tender, non acute Extr no edema/ tenderness Pelvic Cx closed, uterus 10 wks  FHT  Absent   No results found for this or  any previous visit (from the past 24 hour(s)).  No results found.  Assessment/Plan: 26 yo G1 at 10 wks for missed miscarriage and failed medical therapy. Is here for Dilatation and evacuation.  Doxycycline 100mg  IV for perioperative abx coverage  Risks/complications of surgery reviewed incl infection, bleeding, damage to internal organs including bladder, bowels, ureters, blood vessels, other risks from anesthesia, VTE and delayed complications of any surgery, complications in future surgery reviewed.   05/23/2022,

## 2022-05-23 NOTE — Anesthesia Procedure Notes (Signed)
Procedure Name: LMA Insertion Date/Time: 05/23/2022 11:02 AM  Performed by: Norva Pavlov, CRNAPre-anesthesia Checklist: Patient identified, Emergency Drugs available, Suction available and Patient being monitored Patient Re-evaluated:Patient Re-evaluated prior to induction Oxygen Delivery Method: Circle system utilized Preoxygenation: Pre-oxygenation with 100% oxygen Induction Type: IV induction Ventilation: Mask ventilation without difficulty LMA: LMA inserted LMA Size: 4.0 Number of attempts: 1 Airway Equipment and Method: Bite block Placement Confirmation: positive ETCO2 Tube secured with: Tape Dental Injury: Teeth and Oropharynx as per pre-operative assessment

## 2022-05-23 NOTE — Anesthesia Preprocedure Evaluation (Addendum)
Anesthesia Evaluation  Patient identified by MRN, date of birth, ID band Patient awake    Reviewed: Allergy & Precautions, NPO status , Patient's Chart, lab work & pertinent test results  History of Anesthesia Complications Negative for: history of anesthetic complications  Airway Mallampati: I  TM Distance: >3 FB Neck ROM: Full    Dental  (+) Dental Advisory Given   Pulmonary neg pulmonary ROS,    breath sounds clear to auscultation       Cardiovascular  Rhythm:Regular Rate:Normal  Occasional vaso-vagal   Neuro/Psych negative neurological ROS     GI/Hepatic negative GI ROS, Neg liver ROS,   Endo/Other  negative endocrine ROS  Renal/GU negative Renal ROS     Musculoskeletal   Abdominal   Peds  Hematology negative hematology ROS (+)   Anesthesia Other Findings   Reproductive/Obstetrics (+) Pregnancy (missed Ab)                            Anesthesia Physical Anesthesia Plan  ASA: 2  Anesthesia Plan: General   Post-op Pain Management: Tylenol PO (pre-op)*   Induction: Intravenous  PONV Risk Score and Plan: 3 and Dexamethasone and Scopolamine patch - Pre-op  Airway Management Planned: LMA  Additional Equipment: None  Intra-op Plan:   Post-operative Plan:   Informed Consent: I have reviewed the patients History and Physical, chart, labs and discussed the procedure including the risks, benefits and alternatives for the proposed anesthesia with the patient or authorized representative who has indicated his/her understanding and acceptance.     Dental advisory given  Plan Discussed with: CRNA and Surgeon  Anesthesia Plan Comments:         Anesthesia Quick Evaluation

## 2022-05-24 ENCOUNTER — Encounter (HOSPITAL_BASED_OUTPATIENT_CLINIC_OR_DEPARTMENT_OTHER): Payer: Self-pay | Admitting: Obstetrics & Gynecology

## 2022-05-24 LAB — SURGICAL PATHOLOGY
